# Patient Record
Sex: Female | Born: 1970 | Race: White | Hispanic: No | Smoking: Never smoker
Health system: Southern US, Community
[De-identification: ages and names within clinical notes are randomized; demographics above are authoritative.]

## PROBLEM LIST (undated history)

## (undated) DIAGNOSIS — E039 Hypothyroidism, unspecified: Secondary | ICD-10-CM

## (undated) DIAGNOSIS — K635 Polyp of colon: Secondary | ICD-10-CM

## (undated) DIAGNOSIS — Z86010 Personal history of colonic polyps: Secondary | ICD-10-CM

## (undated) DIAGNOSIS — R112 Nausea with vomiting, unspecified: Secondary | ICD-10-CM

## (undated) DIAGNOSIS — M2170 Unequal limb length (acquired), unspecified site: Secondary | ICD-10-CM

## (undated) DIAGNOSIS — N926 Irregular menstruation, unspecified: Secondary | ICD-10-CM

## (undated) DIAGNOSIS — E282 Polycystic ovarian syndrome: Secondary | ICD-10-CM

## (undated) DIAGNOSIS — N840 Polyp of corpus uteri: Secondary | ICD-10-CM

## (undated) DIAGNOSIS — Z8742 Personal history of other diseases of the female genital tract: Secondary | ICD-10-CM

## (undated) DIAGNOSIS — Z9889 Other specified postprocedural states: Secondary | ICD-10-CM

## (undated) DIAGNOSIS — Z8619 Personal history of other infectious and parasitic diseases: Secondary | ICD-10-CM

## (undated) DIAGNOSIS — Z8719 Personal history of other diseases of the digestive system: Secondary | ICD-10-CM

## (undated) DIAGNOSIS — Z87898 Personal history of other specified conditions: Secondary | ICD-10-CM

## (undated) DIAGNOSIS — B379 Candidiasis, unspecified: Secondary | ICD-10-CM

## (undated) HISTORY — DX: Candidiasis, unspecified: B37.9

## (undated) HISTORY — DX: Personal history of other diseases of the female genital tract: Z87.42

## (undated) HISTORY — DX: Polycystic ovarian syndrome: E28.2

## (undated) HISTORY — DX: Polyp of colon: K63.5

## (undated) HISTORY — DX: Personal history of other specified conditions: Z87.898

## (undated) HISTORY — PX: OTHER SURGICAL HISTORY: SHX169

## (undated) HISTORY — DX: Unequal limb length (acquired), unspecified site: M21.70

## (undated) HISTORY — DX: Personal history of other diseases of the digestive system: Z87.19

## (undated) HISTORY — DX: Personal history of colonic polyps: Z86.010

## (undated) HISTORY — DX: Polyp of corpus uteri: N84.0

## (undated) HISTORY — DX: Personal history of other infectious and parasitic diseases: Z86.19

## (undated) HISTORY — DX: Irregular menstruation, unspecified: N92.6

## (undated) HISTORY — PX: COLONOSCOPY W/ POLYPECTOMY: SHX1380

---

## 2003-08-01 DIAGNOSIS — Z87898 Personal history of other specified conditions: Secondary | ICD-10-CM

## 2003-08-01 DIAGNOSIS — N926 Irregular menstruation, unspecified: Secondary | ICD-10-CM

## 2003-08-01 HISTORY — DX: Irregular menstruation, unspecified: N92.6

## 2003-08-01 HISTORY — DX: Personal history of other specified conditions: Z87.898

## 2003-09-29 ENCOUNTER — Encounter: Admission: RE | Admit: 2003-09-29 | Discharge: 2003-09-29 | Payer: Self-pay | Admitting: Internal Medicine

## 2003-11-09 ENCOUNTER — Encounter: Admission: RE | Admit: 2003-11-09 | Discharge: 2003-11-09 | Payer: Self-pay | Admitting: Internal Medicine

## 2004-01-19 ENCOUNTER — Other Ambulatory Visit: Admission: RE | Admit: 2004-01-19 | Discharge: 2004-01-19 | Payer: Self-pay | Admitting: Obstetrics and Gynecology

## 2004-03-24 ENCOUNTER — Encounter (INDEPENDENT_AMBULATORY_CARE_PROVIDER_SITE_OTHER): Payer: Self-pay | Admitting: Specialist

## 2004-03-24 ENCOUNTER — Ambulatory Visit (HOSPITAL_COMMUNITY): Admission: RE | Admit: 2004-03-24 | Discharge: 2004-03-24 | Payer: Self-pay | Admitting: Obstetrics and Gynecology

## 2004-03-24 DIAGNOSIS — N840 Polyp of corpus uteri: Secondary | ICD-10-CM

## 2004-03-24 HISTORY — DX: Polyp of corpus uteri: N84.0

## 2004-03-24 HISTORY — PX: HYSTEROSCOPY W/D&C: SHX1775

## 2004-07-31 HISTORY — PX: OTHER SURGICAL HISTORY: SHX169

## 2004-08-17 ENCOUNTER — Encounter: Admission: RE | Admit: 2004-08-17 | Discharge: 2004-08-17 | Payer: Self-pay | Admitting: Internal Medicine

## 2004-09-14 ENCOUNTER — Encounter: Admission: RE | Admit: 2004-09-14 | Discharge: 2004-09-14 | Payer: Self-pay | Admitting: Family Medicine

## 2004-12-19 ENCOUNTER — Ambulatory Visit (HOSPITAL_COMMUNITY): Admission: RE | Admit: 2004-12-19 | Discharge: 2004-12-19 | Payer: Self-pay | Admitting: Orthopedic Surgery

## 2004-12-19 ENCOUNTER — Ambulatory Visit (HOSPITAL_BASED_OUTPATIENT_CLINIC_OR_DEPARTMENT_OTHER): Admission: RE | Admit: 2004-12-19 | Discharge: 2004-12-19 | Payer: Self-pay | Admitting: Orthopedic Surgery

## 2005-01-10 ENCOUNTER — Other Ambulatory Visit: Admission: RE | Admit: 2005-01-10 | Discharge: 2005-01-10 | Payer: Self-pay | Admitting: Obstetrics and Gynecology

## 2006-01-17 ENCOUNTER — Other Ambulatory Visit: Admission: RE | Admit: 2006-01-17 | Discharge: 2006-01-17 | Payer: Self-pay | Admitting: Obstetrics and Gynecology

## 2006-07-31 DIAGNOSIS — Z8742 Personal history of other diseases of the female genital tract: Secondary | ICD-10-CM

## 2006-07-31 HISTORY — DX: Personal history of other diseases of the female genital tract: Z87.42
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
## 2009-01-20 ENCOUNTER — Encounter (INDEPENDENT_AMBULATORY_CARE_PROVIDER_SITE_OTHER): Payer: Self-pay | Admitting: *Deleted

## 2009-02-02 ENCOUNTER — Ambulatory Visit: Payer: Self-pay | Admitting: Family Medicine

## 2009-02-02 DIAGNOSIS — M2170 Unequal limb length (acquired), unspecified site: Secondary | ICD-10-CM

## 2009-02-02 DIAGNOSIS — M25559 Pain in unspecified hip: Secondary | ICD-10-CM

## 2009-02-04 ENCOUNTER — Telehealth (INDEPENDENT_AMBULATORY_CARE_PROVIDER_SITE_OTHER): Payer: Self-pay | Admitting: *Deleted

## 2009-02-04 LAB — CONVERTED CEMR LAB
BUN: 9 mg/dL (ref 6–23)
Basophils Absolute: 0 10*3/uL (ref 0.0–0.1)
Cholesterol: 171 mg/dL (ref 0–200)
GFR calc non Af Amer: 73.6 mL/min (ref >60)
Glucose, Bld: 90 mg/dL (ref 70–99)
HCT: 41.2 % (ref 36.0–46.0)
Lymphs Abs: 1.8 10*3/uL (ref 0.7–4.0)
MCV: 90.3 fL (ref 78.0–100.0)
Monocytes Absolute: 0.5 10*3/uL (ref 0.1–1.0)
Monocytes Relative: 9.2 % (ref 3.0–12.0)
Platelets: 218 10*3/uL (ref 150.0–400.0)
Potassium: 4.6 meq/L (ref 3.5–5.1)
RDW: 11.8 % (ref 11.5–14.6)
TSH: 4.28 microintl units/mL (ref 0.35–5.50)
Total Bilirubin: 0.7 mg/dL (ref 0.3–1.2)
Triglycerides: 89 mg/dL (ref 0.0–149.0)
VLDL: 17.8 mg/dL (ref 0.0–40.0)

## 2009-02-16 ENCOUNTER — Ambulatory Visit: Payer: Self-pay | Admitting: Sports Medicine

## 2009-02-16 DIAGNOSIS — M629 Disorder of muscle, unspecified: Secondary | ICD-10-CM

## 2009-03-05 ENCOUNTER — Ambulatory Visit: Payer: Self-pay | Admitting: Family Medicine

## 2009-03-19 ENCOUNTER — Encounter: Admission: RE | Admit: 2009-03-19 | Discharge: 2009-03-19 | Payer: Self-pay | Admitting: Family Medicine

## 2009-03-24 ENCOUNTER — Encounter: Payer: Self-pay | Admitting: Family Medicine

## 2009-03-29 ENCOUNTER — Encounter: Admission: RE | Admit: 2009-03-29 | Discharge: 2009-03-29 | Payer: Self-pay | Admitting: Family Medicine

## 2009-03-30 ENCOUNTER — Ambulatory Visit: Payer: Self-pay | Admitting: Sports Medicine

## 2009-04-09 ENCOUNTER — Ambulatory Visit: Payer: Self-pay | Admitting: Internal Medicine

## 2009-04-09 ENCOUNTER — Telehealth (INDEPENDENT_AMBULATORY_CARE_PROVIDER_SITE_OTHER): Payer: Self-pay | Admitting: *Deleted

## 2009-04-23 ENCOUNTER — Ambulatory Visit: Payer: Self-pay | Admitting: Internal Medicine

## 2009-04-23 ENCOUNTER — Encounter: Payer: Self-pay | Admitting: Internal Medicine

## 2009-04-23 DIAGNOSIS — Z860101 Personal history of adenomatous and serrated colon polyps: Secondary | ICD-10-CM

## 2009-04-23 DIAGNOSIS — Z86010 Personal history of colonic polyps: Secondary | ICD-10-CM

## 2009-04-23 HISTORY — DX: Personal history of adenomatous and serrated colon polyps: Z86.0101

## 2009-04-23 HISTORY — DX: Personal history of colonic polyps: Z86.010

## 2009-04-27 ENCOUNTER — Encounter: Payer: Self-pay | Admitting: Internal Medicine

## 2009-04-27 ENCOUNTER — Telehealth: Payer: Self-pay | Admitting: Internal Medicine

## 2009-07-21 ENCOUNTER — Ambulatory Visit: Payer: Self-pay | Admitting: Family Medicine

## 2009-07-26 ENCOUNTER — Encounter (INDEPENDENT_AMBULATORY_CARE_PROVIDER_SITE_OTHER): Payer: Self-pay | Admitting: *Deleted

## 2009-07-26 LAB — CONVERTED CEMR LAB
Free T4: 0.8 ng/dL (ref 0.6–1.6)
TSH: 3.72 microintl units/mL (ref 0.35–5.50)

## 2010-02-14 ENCOUNTER — Ambulatory Visit: Payer: Self-pay | Admitting: Family Medicine

## 2010-02-14 DIAGNOSIS — L259 Unspecified contact dermatitis, unspecified cause: Secondary | ICD-10-CM | POA: Insufficient documentation

## 2010-02-16 ENCOUNTER — Telehealth: Payer: Self-pay | Admitting: Family Medicine

## 2010-02-16 LAB — CONVERTED CEMR LAB: IgE (Immunoglobulin E), Serum: 112.9 intl units/mL (ref 0.0–180.0)

## 2010-05-05 ENCOUNTER — Ambulatory Visit: Payer: Self-pay | Admitting: Family Medicine

## 2010-05-05 ENCOUNTER — Encounter: Admission: RE | Admit: 2010-05-05 | Discharge: 2010-05-05 | Payer: Self-pay | Admitting: Family Medicine

## 2010-05-05 ENCOUNTER — Encounter: Payer: Self-pay | Admitting: Family Medicine

## 2010-05-05 DIAGNOSIS — R000 Tachycardia, unspecified: Secondary | ICD-10-CM | POA: Insufficient documentation

## 2010-05-06 LAB — CONVERTED CEMR LAB
ALT: 14 units/L (ref 0–35)
Albumin: 3.6 g/dL (ref 3.5–5.2)
Alkaline Phosphatase: 41 units/L (ref 39–117)
Basophils Relative: 0.9 % (ref 0.0–3.0)
CO2: 25 meq/L (ref 19–32)
Calcium: 9 mg/dL (ref 8.4–10.5)
Chloride: 104 meq/L (ref 96–112)
Eosinophils Absolute: 0.1 10*3/uL (ref 0.0–0.7)
Hemoglobin: 14 g/dL (ref 12.0–15.0)
Lymphocytes Relative: 30.7 % (ref 12.0–46.0)
MCHC: 34.5 g/dL (ref 30.0–36.0)
MCV: 90.8 fL (ref 78.0–100.0)
Neutro Abs: 3.7 10*3/uL (ref 1.4–7.7)
RBC: 4.46 M/uL (ref 3.87–5.11)
Sodium: 136 meq/L (ref 135–145)
Total CHOL/HDL Ratio: 5
Total Protein: 6.7 g/dL (ref 6.0–8.3)

## 2010-07-31 DIAGNOSIS — Z8719 Personal history of other diseases of the digestive system: Secondary | ICD-10-CM

## 2010-07-31 HISTORY — DX: Personal history of other diseases of the digestive system: Z87.19

## 2010-08-21 ENCOUNTER — Encounter: Payer: Self-pay | Admitting: Internal Medicine

## 2010-08-21 ENCOUNTER — Encounter: Payer: Self-pay | Admitting: Family Medicine

## 2010-08-22 ENCOUNTER — Encounter: Payer: Self-pay | Admitting: Family Medicine
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
## 2010-08-30 NOTE — Assessment & Plan Note (Signed)
Summary: rash on neck/kn   Vital Signs:  Patient profile:   40 year old female Weight:      193.4 pounds Temp:     98.5 degrees F oral BP sitting:   110 / 80 Cuff size:   large  Vitals Entered By: Kathrynn Speed CMA (February 14, 2010 2:33 PM) CC: rash on neck & down chest on naval & some on her arms, src   History of Present Illness: 40 yo woman here today for rash.  sxs started Wed, was very pale.  has progressively worsened/darkened.  was using hydrocortisone and benadryl w/out relief- 'it just makes me tired'.  started on neck and has spread down onto chest, abd, R eyelid, and L forearm.  pt was pulling weeds last weekend, sxs started 4 days later.  no changes in soaps, lotions, detergents, meds.  gave dog a bath "Sunday before sxs started.  Current Medications (verified): 1)  Necon 10/11 (28) 35 Mcg Tabs (Norethin-Eth Estrad Biphasic) .... Once A Day  Allergies (verified): 1)  ! Cipro  Past History:  Social History: Last updated: 02/02/2009 works as PT assistant, [employer] lives alone, w/ dog  Review of Systems      See HPI  Physical Exam  General:  Well-developed,well-nourished,in no acute distress; alert,appropriate and cooperative throughout examination Skin:  dry, erythematous rash on anterior neck extending onto chest.  small vesicles present on chest, abd, arms.  erythematous area on R eyelid.  no pus, induration, or fluctuance.   Impression & Recommendations:  Problem # 1:  CONTACT DERMATITIS (ICD-692.9) Assessment New pt unable to pinpoint cause but given fact areas are spreading will start prednisone for both the itching and the progression.  will test pt for possible allergies.  doesn't appear to be a food allergy as pt doesn't have hives.  reviewed supportive care and red flags that should prompt return.  Pt expresses understanding and is in agreement w/ this plan. Orders: Venipuncture (36415) T-Allergy Profile Region [place], [place], [place], [place], [place] (5484) T-Food  Allergy Profile Specific IgE (86003/[id number])  Her updated medication list for this problem includes:    Prednisone 20 Mg Tabs (Prednisone) ..... 2 tabs daily x14 days  Complete Medication List: 1)  Necon 10/11 (28) 35 Mcg Tabs (Norethin-eth estrad biphasic) .... Once a day 2)  Prednisone 20 Mg Tabs (Prednisone) .... 2 tabs daily x14 days  Patient Instructions: 1)  Follow up as needed 2)  Continue the cortisone cream as needed for itch 3)  Take the prednisone w/ food to avoid upset stomach- 2 daily for 14 days 4)  We'll notify you of your lab results 5)  Call with any questions or concerns 6)  Hang in there!! Prescriptions: PREDNISONE 20 MG TABS (PREDNISONE) 2 tabs daily x14 days  #28 x 0   Entered and Authorized by:   Meilani Edmundson MD   Signed by:   Jafari Mckillop MD on 02/14/2010   Method used:   Electronically to        [facility]  [address] (retail)       [address] [place], [place]  [address]       Ph: [phone]       Fax: [phone]   RxID:   [id number]

## 2010-08-30 NOTE — Progress Notes (Signed)
Summary: labs-lmom  Phone Note Outgoing Call   Summary of Call: only apparent significant allergy is to dust mites.  mild allergy to pet dander.  no food allergies identified.  please call her and let her know.  Left message on machine to call back to office. Signed by Lucious Groves CMA on 02/16/2010 at 8:51 AM  Follow-up for Phone Call        Patient notified. Follow-up by: Lucious Groves CMA,  February 16, 2010 4:25 PM

## 2010-12-16 NOTE — Op Note (Signed)
NAME:  EMBERLEE, SORTINO                ACCOUNT NO.:  [id number]   MEDICAL RECORD NO.:  [id number]          PATIENT TYPE:  AMB   LOCATION:  DSC                          FACILITY:  [facility]   PHYSICIAN:  Harvie Junior, M.D.   DATE OF BIRTH:  1971/05/17   DATE OF PROCEDURE:  12/19/2004  DATE OF DISCHARGE:                                 OPERATIVE REPORT   PREOPERATIVE DIAGNOSIS:  Right wrist pain, status post workup and injection  therapy.   POSTOPERATIVE DIAGNOSIS:  Right wrist pain, status post workup and injection  therapy.   PROCEDURE:  1.  Right wrist arthroscopy with evaluation of wrist joint, debridement of      partial scapho-lunate inter-osseous ligament tear.  2.  Debridement of chondromalacia of the lunate.  3.  Debridement of triangular fibrocartilage tear, peripheral.   SURGEON:  Harvie Junior, M.D.   ANESTHESIA:  General.   INDICATIONS FOR PROCEDURE:  Ms. Oletha Blend is a 40 year old female with a long  history of having had wrist pain.  She ultimately was evaluated with plain  films and MRI which showed no significant pathology other than a small pin-  hole tear of the TFC.  Because of continued complaints of pain, she was  ultimately taken to the operating room for an operative wrist arthroscopy  after a failure of all conservative care.   DESCRIPTION OF PROCEDURE:  The patient was taken to the operating room and  after adequate anesthesia was obtained with general anesthetic, the patient  was placed on the operating table.  The right wrist was prepped and draped  in the usual sterile fashion.  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  This  was debrided to a smooth and stable rim.  The scapho-lunate osseous ligament  was obviously partially torn and there was some tendency towards  dissociation of the scaphoid and lunate.  This  was debrided and an Arthro-  Care wand was used to clean this up quite nicely.   Once this was accomplished, attention was turned over to the triangular  fibrocartilage area where the small dorsal rim tear of the triangular  fibrocartilage area was evaluated and noted to be within normal limits.  There was a small dorsal rent.  At this point the dorsal area was cleaned up  and the Arthro-Care wand was used in this area as well.  Following this, the  wrist was copiously irrigated and suctioned dry.  The arthroscopic portion  closed with a bandage.  A sterile compressive dressing was applied.   The patient was taken to the recovery room and was noted to be in  satisfactory condition.   ESTIMATED BLOOD LOSS:  None.       JLG/MEDQ  D:  12/19/2004  T:  12/19/2004  Job:  [id number]

## 2010-12-16 NOTE — H&P (Signed)
NAME:  Suzanne Taylor, Suzanne Taylor                          ACCOUNT NO.:  [id number]   MEDICAL RECORD NO.:  [id number]                   PATIENT TYPE:  AMB   LOCATION:  SDC                                  [facility]ILITY:  [facility]   PHYSICIAN:  Janine Limbo, M.D.            DATE OF BIRTH:  Dec 10, 1970   DATE OF ADMISSION:  03/24/2004  DATE OF DISCHARGE:                                HISTORY & PHYSICAL   HISTORY OF PRESENT ILLNESS:  The patient is a 40 year old female, para 0,  gravida 0, who complains of a 2-3 year history of irregular cycles. An  ultrasound showed what appeared to be a normal uterus. The endometrium  measured 5-6 mm.  The ovaries appear normal except that there were multiple  small cysts. The patient's most recent Pap smear was within normal limits.  A hydrosonogram was performed, the patient was found to have a 1.9 cm lesion  within the endometrium that was consistent with a polyp.  The patient had  started on oral contraceptives because of a questionable history of  polycystic ovary syndrome.   ALLERGIES:  The patient is allergic to certain FLOXIN medications.   PAST MEDICAL HISTORY:  The patient denies hypertension and diabetes.   SOCIAL HISTORY:  The patient drinks alcohol socially.  She denies cigarette  use and other recreational drug uses.   REVIEW OF SYMPTOMS:  The patient does complain of fatigue.   FAMILY HISTORY:  The patient's mother has heart disease.  The patient's  father died from hypertension and an aneurysm. The father also had kidney  problems. The patient's paternal grandmother had a stroke.   PHYSICAL EXAMINATION:  VITAL SIGNS:  Weight is 211 pounds.  HEENT:  Within normal limits except for fine hair on the face.  CHEST:  Clear.  HEART:  Regular rate and rhythm.  BREASTS:  Without masses.  ABDOMEN:  Nontender.  EXTREMITIES:  Grossly normal.  NEUROLOGIC:  Grossly normal.  PELVIC:  External genitalia is normal. The vagina is normal. Cervix is  nontender.  Uterus normal size, shape and consistency. Adnexa no masses and  rectovaginal exam confirms.   ASSESSMENT:  1. Irregular menstrual cycles.  2. Probable endometrial polyp.  3. Questionable polycystic ovary syndrome.   PLAN:  The patient will undergo hysteroscopy with dilatation and curettage.  We will resect the polyp. The patient understands the indications for her  procedure and she accepts the risk of, but not limited to anesthetic  complications, bleeding, infections and possible damage to the surrounding  organs.                                               Janine Limbo, M.D.    AVS/MEDQ  D:  03/22/2004  T:  03/22/2004  Job:  [id number]  cc:   Sharlet Salina, M.D.  [address]  [place]  [place] [address]  Fax: [phone]

## 2010-12-16 NOTE — Op Note (Signed)
NAME:  Suzanne Taylor, Suzanne Taylor                          ACCOUNT NO.:  [id number]   MEDICAL RECORD NO.:  [id number]                   PATIENT TYPE:  AMB   LOCATION:  [facility]C                                  [facility]   PHYSICIAN:  Janine Limbo, M.D.            DATE OF BIRTH:  1971-03-13   DATE OF PROCEDURE:  03/24/2004  DATE OF DISCHARGE:                                 OPERATIVE REPORT   PREOPERATIVE DIAGNOSES:  1. Irregular menstrual cycles.  2. Endometrial polyp.   POSTOPERATIVE DIAGNOSES:  1. Irregular menstrual cycles.  2. Endometrial polyp.   PROCEDURE:  Hysteroscopy with dilatation and curettage.   SURGEON:  Janine Limbo, M.D.   ANESTHESIA:  General.   DISPOSITION:  Ms. Suzanne Taylor is a 40 year old female, gravida 0, who presents  with the above mentioned diagnosis. She understands the indications for her  procedure and she accepts the risks of but not limited to, anesthetic  complications, bleeding, infections and possible damage to the surrounding  organs.   FINDINGS:  The uterus sounded to 8 cm.  A moderate amount of endometrial  tissue was removed from within the uterine cavity. There was a questionable  small polyp at the fundus.  No adnexal masses were appreciated on  examination under anesthesia.   DESCRIPTION OF PROCEDURE:  The patient was taken to the operating room where  a general anesthetic was given.  The patient's abdomen, perineum, and vagina  were prepped with multiple layers of Betadine. A Foley catheter was placed  in the bladder. The patient was sterilely draped. A paracervical block was  placed using 10 mL of 0.5% Marcaine with epinephrine. An endocervical  curettage was then performed. The uterus sounded to 8 cm. The cervix was  gradually dilated. The diagnostic hysteroscope was inserted and pictures  were taken of the endometrial cavity. The diagnostic hysteroscope was  removed and the cavity was then curetted using a medium sharp curette. The  cavity was felt to be clean at the end of our procedure. The hysteroscope  was again inserted and the cavity was inspected. Hemostasis was adequate.  All instruments were then removed. The estimated blood loss was 10 mL.  The  estimated fluid deficit was 60 mL although there was noted to be fluid on  the floor in the operating room. The patient was awakened from her  anesthetic without difficulty and taken to the recovery room in stable  condition.   FOLLOW UP:  The patient was given a prescription for Tylenol with codeine  and she will take 1 or 2 tablets every 4 hours as needed for pain. She will  return to see Dr. Stefano Gaul in 2-3 weeks for followup examination. She was  given a copy of the postoperative instruction sheet as prepared by the  [facility] for patients who have undergone a dilatation  and curettage.  Janine Limbo, M.D.    AVS/MEDQ  D:  03/24/2004  T:  03/24/2004  Job:  [id number]   cc:   Sharlet Salina, M.D.  [address]  [place]  [place] [address]  Fax: [phone]

## 2010-12-30 HISTORY — PX: WISDOM TOOTH EXTRACTION: SHX21

## 2011-02-23 ENCOUNTER — Ambulatory Visit (INDEPENDENT_AMBULATORY_CARE_PROVIDER_SITE_OTHER): Payer: BC Managed Care – PPO | Admitting: Family Medicine

## 2011-02-23 ENCOUNTER — Encounter: Payer: Self-pay | Admitting: Family Medicine

## 2011-02-23 VITALS — BP 100/70 | Temp 98.9°F | Wt 184.6 lb

## 2011-02-23 DIAGNOSIS — L259 Unspecified contact dermatitis, unspecified cause: Secondary | ICD-10-CM

## 2011-02-23 MED ORDER — TRIAMCINOLONE ACETONIDE 0.1 % EX OINT: TOPICAL_OINTMENT | Freq: Two times a day (BID) | CUTANEOUS | Status: AC

## 2011-02-23 MED ORDER — HYDROXYZINE HCL 50 MG PO TABS: 50.0000 mg | ORAL_TABLET | Freq: Three times a day (TID) | ORAL | Status: AC | PRN

## 2011-02-23 NOTE — Patient Instructions (Signed)
This is poison ivy Take the hydroxyzine as needed for itching- may make you sleepy so try it at night first Use the steroid cream twice a day for itching Call with any questions or concerns Hang in there!

## 2011-02-23 NOTE — Progress Notes (Signed)
  Subjective:    Patient ID: Suzanne Taylor, female    DOB: [birth year], 40 y.o.   MRN: [id number]  HPI Rash- very itchy, first noticed on L wrist and dorsum of L foot.  Has been working in [place] yard weekly.  Has been told by friends and family that it looks like poison ivy.   Review of Systems For ROS see HPI     Objective:   Physical Exam  Vitals reviewed. Constitutional: She appears well-developed and well-nourished. No distress.  Skin: Skin is warm and dry. Rash: on dorsum of L foot and ankle, L wrist- consistent w/ contact dermatitis.          Assessment & Plan:

## 2011-03-07 NOTE — Assessment & Plan Note (Signed)
Pt's rash consistent w/ likely poison ivy.  Start steroid cream bid.  Reviewed supportive care and red flags that should prompt return.  Pt expressed understanding and is in agreement w/ plan.

## 2011-05-23 ENCOUNTER — Other Ambulatory Visit: Payer: Self-pay | Admitting: Family Medicine

## 2011-05-23 DIAGNOSIS — Z1231 Encounter for screening mammogram for malignant neoplasm of breast: Secondary | ICD-10-CM

## 2011-06-20 ENCOUNTER — Ambulatory Visit
Admission: RE | Admit: 2011-06-20 | Discharge: 2011-06-20 | Disposition: A | Discharge status: Home / Self Care | Payer: BC Managed Care – PPO | Source: Ambulatory Visit | Attending: Family Medicine | Admitting: Family Medicine

## 2011-06-20 DIAGNOSIS — Z1231 Encounter for screening mammogram for malignant neoplasm of breast: Secondary | ICD-10-CM

## 2011-06-30 ENCOUNTER — Encounter: Payer: Self-pay | Admitting: Family Medicine

## 2011-06-30 ENCOUNTER — Ambulatory Visit (INDEPENDENT_AMBULATORY_CARE_PROVIDER_SITE_OTHER): Payer: BC Managed Care – PPO | Admitting: Family Medicine

## 2011-06-30 DIAGNOSIS — R238 Other skin changes: Secondary | ICD-10-CM

## 2011-06-30 DIAGNOSIS — Z0000 Encounter for general adult medical examination without abnormal findings: Secondary | ICD-10-CM

## 2011-06-30 DIAGNOSIS — L853 Xerosis cutis: Secondary | ICD-10-CM | POA: Insufficient documentation

## 2011-06-30 DIAGNOSIS — K5900 Constipation, unspecified: Secondary | ICD-10-CM | POA: Insufficient documentation

## 2011-06-30 LAB — BASIC METABOLIC PANEL
BUN: 13 mg/dL (ref 6–23)
Chloride: 108 mEq/L (ref 96–112)
Potassium: 4.2 mEq/L (ref 3.5–5.1)

## 2011-06-30 LAB — CBC WITH DIFFERENTIAL/PLATELET
Basophils Absolute: 0 10*3/uL (ref 0.0–0.1)
Eosinophils Absolute: 0.1 10*3/uL (ref 0.0–0.7)
Eosinophils Relative: 1.7 % (ref 0.0–5.0)
MCHC: 34 g/dL (ref 30.0–36.0)
MCV: 90.8 fl (ref 78.0–100.0)
Monocytes Absolute: 0.5 10*3/uL (ref 0.1–1.0)
Neutrophils Relative %: 50.5 % (ref 43.0–77.0)
Platelets: 248 10*3/uL (ref 150.0–400.0)
RDW: 14.1 % (ref 11.5–14.6)
WBC: 4.7 10*3/uL (ref 4.5–10.5)

## 2011-06-30 LAB — LIPID PANEL
LDL Cholesterol: 114 mg/dL — ABNORMAL HIGH (ref 0–99)
VLDL: 18.8 mg/dL (ref 0.0–40.0)

## 2011-06-30 LAB — HEPATIC FUNCTION PANEL
ALT: 12 U/L (ref 0–35)
AST: 20 U/L (ref 0–37)
Alkaline Phosphatase: 39 U/L (ref 39–117)
Bilirubin, Direct: 0.1 mg/dL (ref 0.0–0.3)
Total Bilirubin: 0.5 mg/dL (ref 0.3–1.2)

## 2011-06-30 NOTE — Assessment & Plan Note (Signed)
Pt's PE WNL.  UTD on health maintenance.  Check labs.  Anticipatory guidance provided.  

## 2011-06-30 NOTE — Assessment & Plan Note (Signed)
Ongoing problem for pt.  Recommended daily Miralax for 2-3 weeks to get bowels moving.  Pt in agreement.  Will follow.

## 2011-06-30 NOTE — Progress Notes (Signed)
  Subjective:    Patient ID: Suzanne Taylor, female    DOB: Apr 15, 1971, 40 y.o.   MRN: [id number]  HPI CPE- UTD on pap and mammo.  Had colonoscopy 2 yrs ago due to family hx and polyps found ([place]).  Constipation- reports bowels have always been slow.  Will take 3 days of miralax every 2-3 weeks and won't have results until Suzanne next Thursday or Friday.  Taking fiber supplements intermittently.  Eating high fiber diet.  Drinking plenty of water.  Dry skin- very dry in places, red inflamed from scratching.  Will have marks where things rub her skin   Review of Systems Patient reports no vision/ hearing changes, adenopathy, fever, weight change,  persistant/recurrent hoarseness , swallowing issues, chest pain, palpitations, edema, persistant/recurrent cough, hemoptysis, dyspnea (rest/exertional/paroxysmal nocturnal), gastrointestinal bleeding (melena, rectal bleeding), abdominal pain, significant heartburn, bowel changes, GU symptoms (dysuria, hematuria, incontinence), Gyn symptoms (abnormal  bleeding, pain),  syncope, focal weakness, memory loss, numbness & tingling, skin/hair/nail changes, abnormal bruising or bleeding, anxiety, or depression.     Objective:   Physical Exam General Appearance:    Alert, cooperative, no distress, appears stated age  Head:    Normocephalic, without obvious abnormality, atraumatic  Eyes:    PERRL, conjunctiva/corneas clear, EOM's intact, fundi    benign, both eyes  Ears:    Normal TM's and external ear canals, both ears  Nose:   Nares normal, septum midline, mucosa normal, no drainage    or sinus tenderness  Throat:   Lips, mucosa, and tongue normal; teeth and gums normal  Neck:   Supple, symmetrical, trachea midline, no adenopathy;    Thyroid: no enlargement/tenderness/nodules  Back:     Symmetric, no curvature, ROM normal, no CVA tenderness  Lungs:     Clear to auscultation bilaterally, respirations unlabored  Chest Wall:    No tenderness or deformity   Heart:    Regular rate and rhythm, S1 and S2 normal, no murmur, rub   or gallop  Breast Exam:    Deferred to GYN  Abdomen:     Soft, non-tender, bowel sounds active all four quadrants,    no masses, no organomegaly  Genitalia:    Deferred to GYN  Rectal:    Extremities:   Extremities normal, atraumatic, no cyanosis or edema  Pulses:   2+ and symmetric all extremities  Skin:   Skin color, texture, turgor normal, no rashes or lesions.  Very dry, erythematous patch on L shoulder  Lymph nodes:   Cervical, supraclavicular, and axillary nodes normal  Neurologic:   CNII-XII intact, normal strength, sensation and reflexes    throughout          Assessment & Plan:

## 2011-06-30 NOTE — Patient Instructions (Signed)
Follow up in 1 year or as needed You look great!  Keep up the good work! Use the steroid cream on the dry, itchy patches Use lotion regularly to keep skin hydrated Take Claritin or Zyrtec daily to decrease post nasal drip and skin response Call with any questions or concerns Happy Holidays!

## 2011-06-30 NOTE — Assessment & Plan Note (Signed)
Very dry and itchy.  Inflamed from scratching.  Some dermatographia.  Start steroid cream to patch on L shoulder.  Encouraged regular moisturizer use.  Daily antihistamine to decrease inflammatory response.  Pt expressed understanding and is in agreement w/ plan.

## 2011-07-02 LAB — VITAMIN D 1,25 DIHYDROXY
Vitamin D 1, 25 (OH)2 Total: 82 pg/mL — ABNORMAL HIGH (ref 18–72)
Vitamin D3 1, 25 (OH)2: 82 pg/mL

## 2011-07-03 ENCOUNTER — Encounter: Payer: Self-pay | Admitting: *Deleted

## 2012-02-13 ENCOUNTER — Ambulatory Visit (INDEPENDENT_AMBULATORY_CARE_PROVIDER_SITE_OTHER): Payer: BC Managed Care – PPO | Admitting: Obstetrics and Gynecology

## 2012-02-13 ENCOUNTER — Encounter: Payer: Self-pay | Admitting: Obstetrics and Gynecology

## 2012-02-13 VITALS — BP 110/62 | Resp 16 | Ht 69.0 in | Wt 176.0 lb

## 2012-02-13 DIAGNOSIS — K635 Polyp of colon: Secondary | ICD-10-CM

## 2012-02-13 DIAGNOSIS — D126 Benign neoplasm of colon, unspecified: Secondary | ICD-10-CM

## 2012-02-13 DIAGNOSIS — Z124 Encounter for screening for malignant neoplasm of cervix: Secondary | ICD-10-CM

## 2012-02-13 DIAGNOSIS — Z01419 Encounter for gynecological examination (general) (routine) without abnormal findings: Secondary | ICD-10-CM

## 2012-02-13 DIAGNOSIS — Z800 Family history of malignant neoplasm of digestive organs: Secondary | ICD-10-CM

## 2012-02-13 DIAGNOSIS — N926 Irregular menstruation, unspecified: Secondary | ICD-10-CM

## 2012-02-13 MED ORDER — NORETHINDRONE-ETH ESTRADIOL 0.5-35 MG-MCG PO TABS: 1.0000 | ORAL_TABLET | Freq: Every day | ORAL | Status: DC

## 2012-02-13 NOTE — Progress Notes (Signed)
Regular Periods: yes Mammogram: yes "06/2011"  Monthly Breast Ex.: no Exercise: yes  Tetanus < 10 years: yes Seatbelts: yes  NI. Bladder Functn.: yes Abuse at home: no  Daily BM's: yes Stressful Work: no "average"  Healthy Diet: yes Sigmoid-Colonoscopy: "2010" Polyp"  Calcium: no Medical problems this year: None per pt   LAST PAP:02/13/2011 "WNL"  Contraception: NECON  Mammogram:  06/2011 "WNL"  PCP: Dr.Tabori  PMH: No Changes  FMH: No Changes  Last Bone Scan: N/A  ANNUAL GYNECOLOGIC EXAMINATION   Suzanne Taylor is a 41 y.o. female, No obstetric history on file., who presents for an annual exam. See above. The patient has a history of irregular menstrual cycles.  Her cycles are well controlled with oral contraceptives.  She is not sexually active.  Her mother has colon cancer.  The patient has had polyps on her colonoscopy.  She does have constipation.  Prior Hysterectomy: No    History   Social History  . Marital Status: Single    Spouse Name: N/A    Number of Children: N/A  . Years of Education: N/A   Social History Main Topics  . Smoking status: Never Smoker   . Smokeless tobacco: Never Used  . Alcohol Use: Yes  . Drug Use: No  . Sexually Active: None   Other Topics Concern  . None   Social History Narrative   Lives alone with dog.    Menstrual cycle:   LMP: Patient's last menstrual period was 01/24/2012.           Cycle: Regular, monthly with normal flow and no severe dysmenorrha  The following portions of the patient's history were reviewed and updated as appropriate: allergies, current medications, past family history, past medical history, past social history, past surgical history and problem list.  Review of Systems Pertinent items are noted in HPI. Breast:Negative for breast lump,nipple discharge or nipple retraction Gastrointestinal: Negative for abdominal pain, change in bowel habits or rectal bleeding Urinary:negative   Objective:    BP  110/62  Resp 16  Ht 5\' 9"  (1.753 m)  Wt 176 lb (79.833 kg)  BMI 25.99 kg/m2  LMP 01/24/2012    Weight:  Wt Readings from Last 1 Encounters:  02/13/12 176 lb (79.833 kg)          BMI: Body mass index is 25.99 kg/(m^2).  General Appearance: Alert, appropriate appearance for age. No acute distress HEENT: Grossly normal Neck / Thyroid: Supple, no masses, nodes or enlargement Lungs: clear to auscultation bilaterally Back: No CVA tenderness Breast Exam: No masses or nodes.No dimpling, nipple retraction or discharge. Cardiovascular: Regular rate and rhythm. S1, S2, no murmur Gastrointestinal: Soft, non-tender, no masses or organomegaly  ++++++++++++++++++++++++++++++++++++++++++++++++++++++++  Pelvic Exam: External genitalia: normal general appearance Vaginal: normal without tenderness, induration or masses and relaxation: No Cervix: normal appearance Adnexa: normal bimanual exam Uterus: normal size, shape, and consistency Rectovaginal: normal rectal, no masses  ++++++++++++++++++++++++++++++++++++++++++++++++++++++++  Lymphatic Exam: Non-palpable nodes in neck, clavicular, axillary, or inguinal regions Neurologic: Normal speech, no tremor  Psychiatric: Alert and oriented, appropriate affect.   Wet Prep:   not applicable Urinalysis:  not applicable UPT:           Not done   Assessment:    Normal gyn exam   Overweight or obese: Yes   Pelvic relaxation: No  Mother with colon cancer.  Constipation  Polyps on colonoscopy.  Irregular cycles/PCOS controlled with oral contraceptives.   Plan:    mammogram pap smear  return annually or prn Contraception:abstinence    Medications prescribed: Necon 0.5/35  STD screen request: none  RPR: No.   HBsAg: No.  Hepatitis C: No.  The updated Pap smear screening guidelines were discussed with the patient. The patient requested that I obtain a Pap smear: Yes.  Kegel exercises discussed: Yes.  Proper diet and regular  exercise were reviewed.  Annual mammograms recommended starting at age 51. Proper breast care was discussed.  Screening colonoscopy is recommended beginning at age 37.  Regular health maintenance was reviewed.  Sleep hygiene was discussed.  Adequate calcium intake was emphasized. The patient has a history of elevated vitamin D.  Mylinda Latina.D.

## 2012-03-18 ENCOUNTER — Ambulatory Visit (HOSPITAL_BASED_OUTPATIENT_CLINIC_OR_DEPARTMENT_OTHER)
Admission: RE | Admit: 2012-03-18 | Discharge: 2012-03-18 | Disposition: A | Discharge status: Home / Self Care | Payer: BC Managed Care – PPO | Source: Ambulatory Visit | Attending: Family Medicine | Admitting: Family Medicine

## 2012-03-18 ENCOUNTER — Ambulatory Visit (INDEPENDENT_AMBULATORY_CARE_PROVIDER_SITE_OTHER): Payer: BC Managed Care – PPO | Admitting: Family Medicine

## 2012-03-18 VITALS — BP 116/76 | HR 74 | Temp 98.0°F | Wt 176.0 lb

## 2012-03-18 DIAGNOSIS — M549 Dorsalgia, unspecified: Secondary | ICD-10-CM | POA: Insufficient documentation

## 2012-03-18 DIAGNOSIS — R1032 Left lower quadrant pain: Secondary | ICD-10-CM | POA: Insufficient documentation

## 2012-03-18 DIAGNOSIS — R319 Hematuria, unspecified: Secondary | ICD-10-CM

## 2012-03-18 LAB — POCT URINALYSIS DIPSTICK
Bilirubin, UA: NEGATIVE
Glucose, UA: NEGATIVE
Nitrite, UA: NEGATIVE
Spec Grav, UA: 1.01

## 2012-03-18 MED ORDER — CYCLOBENZAPRINE HCL 10 MG PO TABS: 10.0000 mg | ORAL_TABLET | Freq: Three times a day (TID) | ORAL | Status: AC | PRN

## 2012-03-18 MED ORDER — CEPHALEXIN 500 MG PO CAPS: 500.0000 mg | ORAL_CAPSULE | Freq: Two times a day (BID) | ORAL | Status: AC

## 2012-03-18 MED ORDER — HYDROCODONE-ACETAMINOPHEN 5-500 MG PO TABS: 1.0000 | ORAL_TABLET | Freq: Three times a day (TID) | ORAL | Status: AC | PRN

## 2012-03-18 NOTE — Progress Notes (Signed)
  Subjective:    Patient ID: Suzanne Taylor, female    DOB: 04-Apr-1971, 41 y.o.   MRN: [id number]  HPI L back pain- sxs started yesterday.  No heavy lifting.  No recent change in activity level.  Pain is located from mid thoracic down.  No change in urination- no blood, frequency, urgency.  No fevers.  Reports this feels muscular. Worse w/ movement.  Nothing improves pain- no relief w/ lidocaine patch.   Review of Systems     Objective:   Physical Exam  Vitals reviewed. Constitutional: She appears well-developed and well-nourished. She appears distressed (obviously uncomfortable).  Abdominal: Soft. She exhibits no distension. There is tenderness (CVA pain, no suprapubic pain). There is no rebound and no guarding.  Musculoskeletal: She exhibits tenderness (only over CVA, no pain along spine, paraspinal muscles, or ribs).          Assessment & Plan:

## 2012-03-18 NOTE — Patient Instructions (Addendum)
I think this is a kidney stone We'll notify you of your CT results Start the Vicodin as needed for pain Hang in there!!!

## 2012-03-19 LAB — URINE CULTURE

## 2012-03-19 NOTE — Assessment & Plan Note (Signed)
New.  Concern for stone given pt's degree of discomfort and fact that it's centered over L kidney.  UA shows large blood that would be consistent w/ stone.  Get CT to assess.  Start Vicodin for pain relief.  If CT negative for stone, will start abx for presumed pyelo.  Pt expressed understanding and is in agreement w/ plan.

## 2012-03-19 NOTE — Assessment & Plan Note (Signed)
New.  See above discussion. 

## 2012-07-09 ENCOUNTER — Other Ambulatory Visit: Payer: Self-pay | Admitting: Family Medicine

## 2012-07-09 DIAGNOSIS — Z1231 Encounter for screening mammogram for malignant neoplasm of breast: Secondary | ICD-10-CM

## 2012-08-15 ENCOUNTER — Ambulatory Visit
Admission: RE | Admit: 2012-08-15 | Discharge: 2012-08-15 | Disposition: A | Discharge status: Home / Self Care | Payer: BC Managed Care – PPO | Source: Ambulatory Visit | Attending: Family Medicine | Admitting: Family Medicine

## 2012-08-15 DIAGNOSIS — Z1231 Encounter for screening mammogram for malignant neoplasm of breast: Secondary | ICD-10-CM

## 2012-09-06 ENCOUNTER — Encounter: Payer: Self-pay | Admitting: Family Medicine

## 2012-09-06 ENCOUNTER — Ambulatory Visit (INDEPENDENT_AMBULATORY_CARE_PROVIDER_SITE_OTHER): Payer: BC Managed Care – PPO | Admitting: Family Medicine

## 2012-09-06 VITALS — BP 120/78 | HR 83 | Temp 98.6°F | Ht 68.5 in | Wt 180.6 lb

## 2012-09-06 DIAGNOSIS — Z0000 Encounter for general adult medical examination without abnormal findings: Secondary | ICD-10-CM

## 2012-09-06 LAB — CBC WITH DIFFERENTIAL/PLATELET
Basophils Relative: 0.8 % (ref 0.0–3.0)
Eosinophils Relative: 1.2 % (ref 0.0–5.0)
HCT: 40.4 % (ref 36.0–46.0)
Lymphs Abs: 1.9 10*3/uL (ref 0.7–4.0)
MCHC: 33.2 g/dL (ref 30.0–36.0)
MCV: 90.3 fl (ref 78.0–100.0)
Monocytes Absolute: 0.4 10*3/uL (ref 0.1–1.0)
Platelets: 231 10*3/uL (ref 150.0–400.0)
RBC: 4.47 Mil/uL (ref 3.87–5.11)
WBC: 5.5 10*3/uL (ref 4.5–10.5)

## 2012-09-06 LAB — HEPATIC FUNCTION PANEL: Albumin: 3.7 g/dL (ref 3.5–5.2)

## 2012-09-06 LAB — TSH: TSH: 2.88 u[IU]/mL (ref 0.35–5.50)

## 2012-09-06 LAB — BASIC METABOLIC PANEL
BUN: 14 mg/dL (ref 6–23)
Calcium: 9 mg/dL (ref 8.4–10.5)
GFR: 73.28 mL/min (ref >60.00)
Glucose, Bld: 84 mg/dL (ref 70–99)

## 2012-09-06 LAB — LIPID PANEL
Cholesterol: 164 mg/dL (ref 0–200)
HDL: 39 mg/dL — ABNORMAL LOW (ref >39.00)
Triglycerides: 85 mg/dL (ref 0.0–149.0)

## 2012-09-06 NOTE — Progress Notes (Signed)
  Subjective:    Patient ID: Suzanne Taylor, female    DOB: January 30, 1971, 42 y.o.   MRN: [id number]  HPI CPE- UTD on GYN, colonoscopy.   Review of Systems Patient reports no vision/ hearing changes, adenopathy,fever, weight change,  persistant/recurrent hoarseness , swallowing issues, chest pain, palpitations, edema, persistant/recurrent cough, hemoptysis, dyspnea (rest/exertional/paroxysmal nocturnal), gastrointestinal bleeding (melena, rectal bleeding), abdominal pain, significant heartburn, bowel changes, GU symptoms (dysuria, hematuria, incontinence), Gyn symptoms (abnormal  bleeding, pain),  syncope, focal weakness, memory loss, numbness & tingling, skin/hair/nail changes, abnormal bruising or bleeding, anxiety, or depression.     Objective:   Physical Exam General Appearance:    Alert, cooperative, no distress, appears stated age  Head:    Normocephalic, without obvious abnormality, atraumatic  Eyes:    PERRL, conjunctiva/corneas clear, EOM's intact, fundi    benign, both eyes  Ears:    Normal TM's and external ear canals, both ears  Nose:   Nares normal, septum midline, mucosa normal, no drainage    or sinus tenderness  Throat:   Lips, mucosa, and tongue normal; teeth and gums normal  Neck:   Supple, symmetrical, trachea midline, no adenopathy;    Thyroid: no enlargement/tenderness/nodules  Back:     Symmetric, no curvature, ROM normal, no CVA tenderness  Lungs:     Clear to auscultation bilaterally, respirations unlabored  Chest Wall:    No tenderness or deformity   Heart:    Regular rate and rhythm, S1 and S2 normal, no murmur, rub   or gallop  Breast Exam:    Deferred to GYN  Abdomen:     Soft, non-tender, bowel sounds active all four quadrants,    no masses, no organomegaly  Genitalia:    Deferred to GYN  Rectal:    Extremities:   Extremities normal, atraumatic, no cyanosis or edema  Pulses:   2+ and symmetric all extremities  Skin:   Skin color, texture, turgor normal, no  rashes or lesions  Lymph nodes:   Cervical, supraclavicular, and axillary nodes normal  Neurologic:   CNII-XII intact, normal strength, sensation and reflexes    throughout          Assessment & Plan:

## 2012-09-06 NOTE — Patient Instructions (Addendum)
Follow up in 1 year or as needed Keep up the good work!  You look great! We'll notify you of your lab results and make any changes if needed Call with any questions or concerns Happy Early Birthday!!! 

## 2012-09-06 NOTE — Assessment & Plan Note (Signed)
Pt's PE WNL.  UTD on GYN, colonoscopy.  Check labs.  Anticipatory guidance provided.  

## 2012-09-09 ENCOUNTER — Encounter: Payer: Self-pay | Admitting: *Deleted

## 2012-09-16 ENCOUNTER — Encounter: Payer: Self-pay | Admitting: *Deleted

## 2013-05-27 ENCOUNTER — Encounter: Payer: Self-pay | Admitting: Family Medicine

## 2013-05-27 ENCOUNTER — Ambulatory Visit (INDEPENDENT_AMBULATORY_CARE_PROVIDER_SITE_OTHER): Payer: BC Managed Care – PPO | Admitting: Family Medicine

## 2013-05-27 VITALS — BP 110/70 | HR 74 | Temp 98.1°F | Resp 16 | Wt 178.0 lb

## 2013-05-27 DIAGNOSIS — B0230 Zoster ocular disease, unspecified: Secondary | ICD-10-CM | POA: Insufficient documentation

## 2013-05-27 NOTE — Progress Notes (Signed)
  Subjective:    Patient ID: Suzanne Taylor, female    DOB: [birth year], 42 y.o.   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  Woke up yesterday w/ vesicle on medial upper eye lid consistent w/ previous herpes outbreaks.  Today woke up w/ conjunctivitis, photophobia, excessive tearing.  'i usually let it run its course but now it looks like pink eye'.  Some blurry vision.   Review of Systems For ROS see HPI     Objective:   Physical Exam  Vitals reviewed. Constitutional: She appears well-developed and well-nourished. No distress.  HENT:  Head: Normocephalic and atraumatic.  Eyes: EOM are normal. Pupils are equal, round, and reactive to light. Right eye exhibits discharge (watery drainage, nonpurulent).  Marked R conjunctival injxn Small vesicle on R medial upper lid          Assessment & Plan:

## 2013-05-27 NOTE — Assessment & Plan Note (Signed)
New to provider, apparently ongoing for pt.  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  Will have pt emergently see eye doctor to determine appropriate course of tx.  Pt expressed understanding and is in agreement w/ plan.

## 2013-05-27 NOTE — Patient Instructions (Signed)
Go immediately to [facility]

## 2013-07-31 LAB — HM MAMMOGRAPHY: HM Mammogram: NORMAL

## 2013-08-14 ENCOUNTER — Other Ambulatory Visit: Payer: Self-pay

## 2013-08-14 DIAGNOSIS — Z1231 Encounter for screening mammogram for malignant neoplasm of breast: Secondary | ICD-10-CM

## 2013-09-01 ENCOUNTER — Ambulatory Visit
Admission: RE | Admit: 2013-09-01 | Discharge: 2013-09-01 | Disposition: A | Discharge status: Home / Self Care | Payer: BC Managed Care – PPO | Source: Ambulatory Visit

## 2013-09-01 DIAGNOSIS — Z1231 Encounter for screening mammogram for malignant neoplasm of breast: Secondary | ICD-10-CM

## 2013-12-11 ENCOUNTER — Telehealth: Payer: Self-pay

## 2013-12-11 NOTE — Telephone Encounter (Signed)
Medication and allergies:  Reviewed and updated  90 day supply/mail order: n/a Local pharmacy:  [facility] # [place], [place]   Immunizations due:  UTD   A/P: No changes to personal, family history or past surgical hx PAP- 02/13/12-negative CCS- 04/23/09- adenomatous polyp otw normal; repeat colonoscopy in 5 years (03/2014) MMG- 09/01/13- negative Tdap- 02/02/09  To Discuss with Provider: Nothing at this time.

## 2013-12-12 ENCOUNTER — Encounter: Payer: Self-pay | Admitting: General Practice

## 2013-12-12 ENCOUNTER — Encounter: Payer: Self-pay | Admitting: Family Medicine

## 2013-12-12 ENCOUNTER — Ambulatory Visit (INDEPENDENT_AMBULATORY_CARE_PROVIDER_SITE_OTHER): Payer: BC Managed Care – PPO | Admitting: Family Medicine

## 2013-12-12 VITALS — BP 108/72 | HR 83 | Temp 98.3°F | Resp 16 | Ht 69.5 in | Wt 169.1 lb

## 2013-12-12 DIAGNOSIS — E049 Nontoxic goiter, unspecified: Secondary | ICD-10-CM

## 2013-12-12 DIAGNOSIS — Z0000 Encounter for general adult medical examination without abnormal findings: Secondary | ICD-10-CM

## 2013-12-12 DIAGNOSIS — E039 Hypothyroidism, unspecified: Secondary | ICD-10-CM | POA: Insufficient documentation

## 2013-12-12 DIAGNOSIS — E010 Iodine-deficiency related diffuse (endemic) goiter: Secondary | ICD-10-CM

## 2013-12-12 LAB — CBC WITH DIFFERENTIAL/PLATELET
BASOS PCT: 0.8 % (ref 0.0–3.0)
Basophils Absolute: 0 10*3/uL (ref 0.0–0.1)
EOS ABS: 0.1 10*3/uL (ref 0.0–0.7)
EOS PCT: 1.9 % (ref 0.0–5.0)
HEMATOCRIT: 41.2 % (ref 36.0–46.0)
Hemoglobin: 13.9 g/dL (ref 12.0–15.0)
LYMPHS ABS: 1.8 10*3/uL (ref 0.7–4.0)
Lymphocytes Relative: 36.2 % (ref 12.0–46.0)
MCHC: 33.7 g/dL (ref 30.0–36.0)
MCV: 90.5 fl (ref 78.0–100.0)
MONO ABS: 0.4 10*3/uL (ref 0.1–1.0)
Monocytes Relative: 7.8 % (ref 3.0–12.0)
Neutro Abs: 2.7 10*3/uL (ref 1.4–7.7)
Neutrophils Relative %: 53.3 % (ref 43.0–77.0)
Platelets: 282 10*3/uL (ref 150.0–400.0)
RBC: 4.55 Mil/uL (ref 3.87–5.11)
RDW: 13.5 % (ref 11.5–15.5)
WBC: 5.1 10*3/uL (ref 4.0–10.5)

## 2013-12-12 LAB — LIPID PANEL
CHOL/HDL RATIO: 5
Cholesterol: 167 mg/dL (ref 0–200)
HDL: 36.2 mg/dL — ABNORMAL LOW (ref >39.00)
LDL CALC: 106 mg/dL — AB (ref 0–99)
TRIGLYCERIDES: 125 mg/dL (ref 0.0–149.0)
VLDL: 25 mg/dL (ref 0.0–40.0)

## 2013-12-12 LAB — HEPATIC FUNCTION PANEL
ALT: 11 U/L (ref 0–35)
AST: 19 U/L (ref 0–37)
Albumin: 3.7 g/dL (ref 3.5–5.2)
Alkaline Phosphatase: 36 U/L — ABNORMAL LOW (ref 39–117)
BILIRUBIN DIRECT: 0.1 mg/dL (ref 0.0–0.3)
BILIRUBIN TOTAL: 0.7 mg/dL (ref 0.2–1.2)
Total Protein: 6.8 g/dL (ref 6.0–8.3)

## 2013-12-12 LAB — BASIC METABOLIC PANEL
BUN: 9 mg/dL (ref 6–23)
CHLORIDE: 108 meq/L (ref 96–112)
CO2: 24 meq/L (ref 19–32)
CREATININE: 1 mg/dL (ref 0.4–1.2)
Calcium: 9.2 mg/dL (ref 8.4–10.5)
GFR: 63.69 mL/min (ref >60.00)
Glucose, Bld: 84 mg/dL (ref 70–99)
Potassium: 4.4 mEq/L (ref 3.5–5.1)
SODIUM: 140 meq/L (ref 135–145)

## 2013-12-12 LAB — TSH: TSH: 2.55 u[IU]/mL (ref 0.35–4.50)

## 2013-12-12 NOTE — Progress Notes (Signed)
Pre visit review using our clinic review tool, if applicable. No additional management support is needed unless otherwise documented below in the visit note. 

## 2013-12-12 NOTE — Assessment & Plan Note (Signed)
New.  Get [place] to assess.

## 2013-12-12 NOTE — Progress Notes (Signed)
   Subjective:    Patient ID: Suzanne Taylor, female    DOB: 1971-04-09, 43 y.o.   MRN: [id number]  HPI CPE- UTD on pap, mammo.  Had colonoscopy 2010 due to family hx- due for repeat this year (September)   Review of Systems Patient reports no vision/ hearing changes, adenopathy,fever, weight change,  persistant/recurrent hoarseness , swallowing issues, chest pain, palpitations, edema, persistant/recurrent cough, hemoptysis, dyspnea (rest/exertional/paroxysmal nocturnal), gastrointestinal bleeding (melena, rectal bleeding), abdominal pain, significant heartburn, bowel changes, GU symptoms (dysuria, hematuria, incontinence), Gyn symptoms (abnormal  bleeding, pain),  syncope, focal weakness, memory loss, numbness & tingling, skin/hair/nail changes, abnormal bruising or bleeding, anxiety, or depression.     Objective:   Physical Exam General Appearance:    Alert, cooperative, no distress, appears stated age  Head:    Normocephalic, without obvious abnormality, atraumatic  Eyes:    PERRL, conjunctiva/corneas clear, EOM's intact, fundi    benign, both eyes  Ears:    Normal TM's and external ear canals, both ears  Nose:   Nares normal, septum midline, mucosa normal, no drainage    or sinus tenderness  Throat:   Lips, mucosa, and tongue normal; teeth and gums normal  Neck:   Supple, symmetrical, trachea midline, no adenopathy;    Thyroid: diffuse fullness w/ possible inferior nodule  Back:     Symmetric, no curvature, ROM normal, no CVA tenderness  Lungs:     Clear to auscultation bilaterally, respirations unlabored  Chest Wall:    No tenderness or deformity   Heart:    Regular rate and rhythm, S1 and S2 normal, no murmur, rub   or gallop  Breast Exam:    Deferred to GYN  Abdomen:     Soft, non-tender, bowel sounds active all four quadrants,    no masses, no organomegaly  Genitalia:    Deferred to GYN  Rectal:    Extremities:   Extremities normal, atraumatic, no cyanosis or edema  Pulses:    2+ and symmetric all extremities  Skin:   Skin color, texture, turgor normal, no rashes or lesions  Lymph nodes:   Cervical, supraclavicular, and axillary nodes normal  Neurologic:   CNII-XII intact, normal strength, sensation and reflexes    throughout          Assessment & Plan:

## 2013-12-12 NOTE — Assessment & Plan Note (Signed)
PE WNL.  UTD on GYN.  Check labs.  Anticipatory guidance provided.

## 2013-12-12 NOTE — Patient Instructions (Signed)
Follow up in 1 year or as needed We'll notify you of your lab results and make any changes if needed We'll call you with your [place] appt Call with any questions or concerns Keep up the good work!  You look great! Happy Summer!!!

## 2013-12-17 ENCOUNTER — Ambulatory Visit (HOSPITAL_BASED_OUTPATIENT_CLINIC_OR_DEPARTMENT_OTHER)
Admission: RE | Admit: 2013-12-17 | Discharge: 2013-12-17 | Disposition: A | Discharge status: Home / Self Care | Payer: BC Managed Care – PPO | Source: Ambulatory Visit | Attending: Family Medicine | Admitting: Family Medicine

## 2013-12-17 DIAGNOSIS — R599 Enlarged lymph nodes, unspecified: Secondary | ICD-10-CM | POA: Insufficient documentation

## 2013-12-17 DIAGNOSIS — E049 Nontoxic goiter, unspecified: Secondary | ICD-10-CM | POA: Insufficient documentation

## 2013-12-17 DIAGNOSIS — E010 Iodine-deficiency related diffuse (endemic) goiter: Secondary | ICD-10-CM

## 2013-12-18 ENCOUNTER — Telehealth: Payer: Self-pay | Admitting: Family Medicine

## 2013-12-18 ENCOUNTER — Encounter: Payer: Self-pay | Admitting: General Practice

## 2013-12-18 LAB — VITAMIN D 1,25 DIHYDROXY
Vitamin D 1, 25 (OH)2 Total: 62 pg/mL (ref 18–72)
Vitamin D2 1, 25 (OH)2: <8 pg/mL
Vitamin D3 1, 25 (OH)2: 62 pg/mL

## 2013-12-18 NOTE — Telephone Encounter (Signed)
Pt notified of lab results

## 2013-12-18 NOTE — Telephone Encounter (Signed)
Caller name: Tyreka  Call back number: [phone]   Reason for call:  Returning call

## 2014-03-28 ENCOUNTER — Encounter: Payer: Self-pay | Admitting: Internal Medicine

## 2014-06-10 ENCOUNTER — Encounter: Payer: Self-pay | Admitting: Internal Medicine

## 2014-08-06 ENCOUNTER — Encounter: Payer: Self-pay | Admitting: Internal Medicine

## 2014-09-25 ENCOUNTER — Ambulatory Visit (AMBULATORY_SURGERY_CENTER): Payer: Self-pay

## 2014-09-25 VITALS — Ht 69.0 in | Wt 179.0 lb

## 2014-09-25 DIAGNOSIS — Z860100 Personal history of colon polyps, unspecified: Secondary | ICD-10-CM

## 2014-09-25 NOTE — Progress Notes (Signed)
No allergies to eggs or soy No diet/weight loss meds No home oxygen No past problems with anesthesia (PONV with general)  Has email  Emmi instructions given for colonoscopy

## 2014-10-09 ENCOUNTER — Encounter: Payer: Self-pay | Admitting: Internal Medicine

## 2014-10-09 ENCOUNTER — Ambulatory Visit (AMBULATORY_SURGERY_CENTER): Payer: BLUE CROSS/BLUE SHIELD | Admitting: Internal Medicine

## 2014-10-09 VITALS — BP 112/69 | HR 65 | Temp 99.1°F | Resp 14 | Ht 69.0 in | Wt 179.0 lb

## 2014-10-09 DIAGNOSIS — Z8371 Family history of colonic polyps: Secondary | ICD-10-CM

## 2014-10-09 DIAGNOSIS — Z86010 Personal history of colonic polyps: Secondary | ICD-10-CM

## 2014-10-09 MED ORDER — SODIUM CHLORIDE 0.9 % IV SOLN
500.0000 mL | INTRAVENOUS | Status: DC
Start: 2014-10-09 — End: 2014-10-09

## 2014-10-09 NOTE — Op Note (Signed)
[place]  [address] [place], [address]   COLONOSCOPY PROCEDURE REPORT  PATIENT: Suzanne Taylor, Suzanne Taylor  MR#: #[id number] BIRTHDATE: 1971-03-16 , 75  yrs. old GENDER: female ENDOSCOPIST: Gatha Mayer, MD, [facility] PROCEDURE DATE:  10/09/2014 PROCEDURE:   Colonoscopy, surveillance First Screening Colonoscopy - Avg.  risk and is 50 yrs.  old or older - No.  Prior Negative Screening - Now for repeat screening. N/A  History of Adenoma - Now for follow-up colonoscopy & has been > or = to 3 yrs.  Yes hx of adenoma.  Has been 3 or more years since last colonoscopy. ASA CLASS:   Class II INDICATIONS:Surveillance due to prior colonic neoplasia, PH Colon Adenoma, and FH Colon Adenoma. MEDICATIONS: Propofol 300 mg IV and Monitored anesthesia care  DESCRIPTION OF PROCEDURE:   After the risks benefits and alternatives of the procedure were thoroughly explained, informed consent was obtained.  The digital rectal exam revealed no abnormalities of the rectum.   The LB [id number]  endoscope was introduced through the anus and advanced to the cecum, which was identified by both the appendix and ileocecal valve. No adverse events experienced.   The quality of the prep was adequate (MiraLax was used)  The instrument was then slowly withdrawn as the colon was fully examined.      COLON FINDINGS: A normal appearing cecum, ileocecal valve, and appendiceal orifice were identified.  The ascending, transverse, descending, sigmoid colon, and rectum appeared unremarkable. Retroflexed views revealed no abnormalities. The time to cecum = 6.3 Withdrawal time = 10.9   The scope was withdrawn and the procedure completed. COMPLICATIONS: There were no immediate complications.  ENDOSCOPIC IMPRESSION: Normal colonoscopy  RECOMMENDATIONS: Repeat Colonoscopy in 5 years - given PH adenoma and FH adenomas  eSigned:  Gatha Mayer, MD, [facility] 10/09/2014 2:18 PM   cc: The  Patient

## 2014-10-09 NOTE — Progress Notes (Signed)
Report to PACU, RN, vss, BBS= Clear.  

## 2014-10-09 NOTE — Patient Instructions (Signed)
YOU HAD AN ENDOSCOPIC PROCEDURE TODAY AT [facility]:   Refer to the procedure report that was given to you for any specific questions about what was found during the examination.  If the procedure report does not answer your questions, please call your gastroenterologist to clarify.  If you requested that your care partner not be given the details of your procedure findings, then the procedure report has been included in a sealed envelope for you to review at your convenience later.  YOU SHOULD EXPECT: Some feelings of bloating in the abdomen. Passage of more gas than usual.  Walking can help get rid of the air that was put into your GI tract during the procedure and reduce the bloating. If you had a lower endoscopy (such as a colonoscopy or flexible sigmoidoscopy) you may notice spotting of blood in your stool or on the toilet paper. If you underwent a bowel prep for your procedure, you may not have a normal bowel movement for a few days.  Please Note:  You might notice some irritation and congestion in your nose or some drainage.  This is from the oxygen used during your procedure.  There is no need for concern and it should clear up in a day or so.  SYMPTOMS TO REPORT IMMEDIATELY:   Following lower endoscopy (colonoscopy or flexible sigmoidoscopy):  Excessive amounts of blood in the stool  Significant tenderness or worsening of abdominal pains  Swelling of the abdomen that is new, acute  Fever of 100F or higher   For urgent or emergent issues, a gastroenterologist can be reached at any hour by calling [phone].   DIET: Your first meal following the procedure should be a small meal and then it is ok to progress to your normal diet. Heavy or fried foods are harder to digest and may make you feel nauseous or bloated.  Likewise, meals heavy in dairy and vegetables can increase bloating.  Drink plenty of fluids but you should avoid alcoholic beverages for 24  hours.  ACTIVITY:  You should plan to take it easy for the rest of today and you should NOT DRIVE or use heavy machinery until tomorrow (because of the sedation medicines used during the test).    FOLLOW UP: Our staff will call the number listed on your records the next business day following your procedure to check on you and address any questions or concerns that you may have regarding the information given to you following your procedure. If we do not reach you, we will leave a message.  However, if you are feeling well and you are not experiencing any problems, there is no need to return our call.  We will assume that you have returned to your regular daily activities without incident.  If any biopsies were taken you will be contacted by phone or by letter within the next 1-3 weeks.  Please call us at [phone] if you have not heard about the biopsies in 3 weeks.    SIGNATURES/CONFIDENTIALITY: You and/or your care partner have signed paperwork which will be entered into your electronic medical record.  These signatures attest to the fact that that the information above on your After Visit Summary has been reviewed and is understood.  Full responsibility of the confidentiality of this discharge information lies with you and/or your care-partner.  Return 5 years-2021

## 2014-10-12 ENCOUNTER — Telehealth: Payer: Self-pay

## 2014-10-12 NOTE — Telephone Encounter (Signed)
  Follow up Call-  Call back number 10/09/2014  Post procedure Call Back phone  # [phone]  Permission to leave phone message Yes     Patient questions:  Do you have a fever, pain , or abdominal swelling? No. Pain Score  0 *  Have you tolerated food without any problems? Yes.    Have you been able to return to your normal activities? Yes.    Do you have any questions about your discharge instructions: Diet   No. Medications  No. Follow up visit  No.  Do you have questions or concerns about your Care? No.  Actions: * If pain score is 4 or above: No action needed, pain <4.

## 2014-11-02 ENCOUNTER — Other Ambulatory Visit: Payer: Self-pay

## 2014-11-02 DIAGNOSIS — Z1231 Encounter for screening mammogram for malignant neoplasm of breast: Secondary | ICD-10-CM

## 2014-11-05 ENCOUNTER — Ambulatory Visit
Admission: RE | Admit: 2014-11-05 | Discharge: 2014-11-05 | Disposition: A | Discharge status: Home / Self Care | Payer: BLUE CROSS/BLUE SHIELD | Source: Ambulatory Visit

## 2014-11-05 DIAGNOSIS — Z1231 Encounter for screening mammogram for malignant neoplasm of breast: Secondary | ICD-10-CM

## 2015-01-04 ENCOUNTER — Telehealth: Payer: Self-pay | Admitting: Family Medicine

## 2015-01-04 NOTE — Telephone Encounter (Signed)
PCP first available is in October pt requesting CPE with another provider please advise if this is ok.

## 2015-01-04 NOTE — Telephone Encounter (Signed)
There should not be an urgency to pt having physical as this is supposed to be a well visit.  We can add her to a cancellation list and call her if an appt is available but it is not appropriate to see another provider for her physical

## 2015-01-05 NOTE — Telephone Encounter (Signed)
There was a cancellation pt scheduled for 01/20/2015.

## 2015-01-05 NOTE — Telephone Encounter (Signed)
Pt ca be scheduled for a Thyroid follow up.

## 2015-01-05 NOTE — Telephone Encounter (Signed)
Pt was placed on waiting list. Pt states her concern is having her thyroid, cholesterol and labs done. Please advise

## 2015-01-19 ENCOUNTER — Telehealth: Payer: Self-pay

## 2015-01-19 NOTE — Telephone Encounter (Signed)
Left a message for call back.  Pap- 02/13/12 (will be due next month) Tdap-02/02/09 CCS- 10/09/14- normal, repeat in 5 yrs- Dr. Carlean Purl MMg- 05/06/10 DUE

## 2015-01-20 ENCOUNTER — Encounter: Payer: Self-pay | Admitting: Family Medicine

## 2015-01-20 ENCOUNTER — Ambulatory Visit (INDEPENDENT_AMBULATORY_CARE_PROVIDER_SITE_OTHER): Payer: BLUE CROSS/BLUE SHIELD | Admitting: Family Medicine

## 2015-01-20 VITALS — BP 114/72 | HR 85 | Temp 98.2°F | Resp 16 | Ht 69.0 in | Wt 180.4 lb

## 2015-01-20 DIAGNOSIS — Z0000 Encounter for general adult medical examination without abnormal findings: Secondary | ICD-10-CM

## 2015-01-20 NOTE — Progress Notes (Signed)
   Subjective:    Patient ID: Suzanne Taylor, female    DOB: 03-Jul-1971, 44 y.o.   MRN: [id number]  HPI CPE- UTD on pap Raphael Gibney), mammo, colonoscopy Carlean Purl)   Review of Systems Patient reports no vision/ hearing changes, adenopathy,fever, weight change,  persistant/recurrent hoarseness , swallowing issues, chest pain, palpitations, edema, persistant/recurrent cough, hemoptysis, dyspnea (rest/exertional/paroxysmal nocturnal), gastrointestinal bleeding (melena, rectal bleeding), abdominal pain, significant heartburn, bowel changes, GU symptoms (dysuria, hematuria, incontinence), Gyn symptoms (abnormal  bleeding, pain),  syncope, focal weakness, memory loss, numbness & tingling, skin/hair/nail changes, abnormal bruising or bleeding, anxiety, or depression.     Objective:   Physical Exam General Appearance:    Alert, cooperative, no distress, appears stated age  Head:    Normocephalic, without obvious abnormality, atraumatic  Eyes:    PERRL, conjunctiva/corneas clear, EOM's intact, fundi    benign, both eyes  Ears:    Normal TM's and external ear canals, both ears  Nose:   Nares normal, septum midline, mucosa normal, no drainage    or sinus tenderness  Throat:   Lips, mucosa, and tongue normal; teeth and gums normal  Neck:   Supple, symmetrical, trachea midline, no adenopathy;    Thyroid: no enlargement/tenderness/nodules  Back:     Symmetric, no curvature, ROM normal, no CVA tenderness  Lungs:     Clear to auscultation bilaterally, respirations unlabored  Chest Wall:    No tenderness or deformity   Heart:    Regular rate and rhythm, S1 and S2 normal, no murmur, rub   or gallop  Breast Exam:    Deferred to GYN  Abdomen:     Soft, non-tender, bowel sounds active all four quadrants,    no masses, no organomegaly  Genitalia:    Deferred to GYN  Rectal:    Extremities:   Extremities normal, atraumatic, no cyanosis or edema  Pulses:   2+ and symmetric all extremities  Skin:   Skin color,  texture, turgor normal, no rashes or lesions  Lymph nodes:   Cervical, supraclavicular, and axillary nodes normal  Neurologic:   CNII-XII intact, normal strength, sensation and reflexes    throughout          Assessment & Plan:

## 2015-01-20 NOTE — Telephone Encounter (Signed)
Unable to reach patient prior to visit  

## 2015-01-20 NOTE — Progress Notes (Signed)
Pre visit review using our clinic review tool, if applicable. No additional management support is needed unless otherwise documented below in the visit note. 

## 2015-01-20 NOTE — Assessment & Plan Note (Signed)
Pt's PE WNL.  UTD on GYN and colonoscopy.  Check labs.  Anticipatory guidance provided.  

## 2015-01-20 NOTE — Patient Instructions (Signed)
Follow up in 1 year or as needed We'll notify you of your lab results and make any changes if needed Keep up the good work on healthy diet and regular exercise Call with any questions or concerns Have a great summer!!!

## 2015-01-21 ENCOUNTER — Encounter: Payer: Self-pay | Admitting: General Practice

## 2015-01-21 LAB — CBC WITH DIFFERENTIAL/PLATELET
BASOS PCT: 2.3 % (ref 0.0–3.0)
Basophils Absolute: 0.2 10*3/uL — ABNORMAL HIGH (ref 0.0–0.1)
Eosinophils Absolute: 0.1 10*3/uL (ref 0.0–0.7)
Eosinophils Relative: 1.6 % (ref 0.0–5.0)
HEMATOCRIT: 41 % (ref 36.0–46.0)
Hemoglobin: 13.8 g/dL (ref 12.0–15.0)
Lymphocytes Relative: 37.8 % (ref 12.0–46.0)
Lymphs Abs: 3 10*3/uL (ref 0.7–4.0)
MCHC: 33.6 g/dL (ref 30.0–36.0)
MCV: 90.3 fl (ref 78.0–100.0)
Monocytes Absolute: 0.5 10*3/uL (ref 0.1–1.0)
Monocytes Relative: 6.3 % (ref 3.0–12.0)
Neutro Abs: 4.1 10*3/uL (ref 1.4–7.7)
Neutrophils Relative %: 52 % (ref 43.0–77.0)
PLATELETS: 265 10*3/uL (ref 150.0–400.0)
RBC: 4.55 Mil/uL (ref 3.87–5.11)
RDW: 13.4 % (ref 11.5–15.5)
WBC: 7.9 10*3/uL (ref 4.0–10.5)

## 2015-01-21 LAB — HEPATIC FUNCTION PANEL
ALBUMIN: 4.1 g/dL (ref 3.5–5.2)
ALT: 11 U/L (ref 0–35)
AST: 17 U/L (ref 0–37)
Alkaline Phosphatase: 50 U/L (ref 39–117)
Bilirubin, Direct: 0.1 mg/dL (ref 0.0–0.3)
TOTAL PROTEIN: 7.2 g/dL (ref 6.0–8.3)
Total Bilirubin: 0.6 mg/dL (ref 0.2–1.2)

## 2015-01-21 LAB — BASIC METABOLIC PANEL
BUN: 11 mg/dL (ref 6–23)
CHLORIDE: 101 meq/L (ref 96–112)
CO2: 28 meq/L (ref 19–32)
Calcium: 9.6 mg/dL (ref 8.4–10.5)
Creatinine, Ser: 0.93 mg/dL (ref 0.40–1.20)
GFR: 68.91 mL/min (ref >60.00)
Glucose, Bld: 76 mg/dL (ref 70–99)
POTASSIUM: 4 meq/L (ref 3.5–5.1)
SODIUM: 136 meq/L (ref 135–145)

## 2015-01-21 LAB — LIPID PANEL
CHOL/HDL RATIO: 4
Cholesterol: 177 mg/dL (ref 0–200)
HDL: 44.4 mg/dL (ref >39.00)
LDL CALC: 110 mg/dL — AB (ref 0–99)
NONHDL: 132.6
Triglycerides: 112 mg/dL (ref 0.0–149.0)
VLDL: 22.4 mg/dL (ref 0.0–40.0)

## 2015-01-21 LAB — TSH: TSH: 3.28 u[IU]/mL (ref 0.35–4.50)

## 2015-01-21 LAB — VITAMIN D 25 HYDROXY (VIT D DEFICIENCY, FRACTURES): VITD: 44.45 ng/mL (ref 30.00–100.00)

## 2015-09-30 ENCOUNTER — Encounter: Payer: Self-pay | Admitting: Medical

## 2015-09-30 ENCOUNTER — Ambulatory Visit (INDEPENDENT_AMBULATORY_CARE_PROVIDER_SITE_OTHER): Payer: BLUE CROSS/BLUE SHIELD | Admitting: Medical

## 2015-09-30 VITALS — BP 116/76 | HR 78 | Temp 98.2°F | Ht 69.0 in | Wt 170.2 lb

## 2015-09-30 DIAGNOSIS — N390 Urinary tract infection, site not specified: Secondary | ICD-10-CM | POA: Diagnosis not present

## 2015-09-30 DIAGNOSIS — R8290 Unspecified abnormal findings in urine: Secondary | ICD-10-CM

## 2015-09-30 DIAGNOSIS — R82998 Other abnormal findings in urine: Secondary | ICD-10-CM

## 2015-09-30 LAB — POC URINALSYSI DIPSTICK (AUTOMATED)
BILIRUBIN UA: NEGATIVE
Glucose, UA: NEGATIVE
KETONES UA: NEGATIVE
PH UA: 6
SPEC GRAV UA: 1.02
Urobilinogen, UA: 0.2

## 2015-09-30 MED ORDER — NITROFURANTOIN MONOHYD MACRO 100 MG PO CAPS: 100.0000 mg | ORAL_CAPSULE | Freq: Two times a day (BID) | ORAL | Status: DC

## 2015-09-30 NOTE — Progress Notes (Signed)
Pre visit review using our clinic review tool, if applicable. No additional management support is needed unless otherwise documented below in the visit note. 

## 2015-09-30 NOTE — Patient Instructions (Signed)
Your appear to have a urinary tract infection . I am prescribing macrobid  antibiotic for the probable infection. Hydrate well. I am sending out a urine culture. During the interim if your signs and symptoms worsen rather than improving please notify us. We will notify your when the culture results are back.  Follow up in 7 days or as needed. 

## 2015-09-30 NOTE — Progress Notes (Signed)
Subjective:    Patient ID: Suzanne Taylor, female    DOB: 1971-02-18, 45 y.o.   MRN: [id number]  HPI  Pt in today reporting urinary symptoms for off and on for 2 wks.  Dysuria- no Frequent urination- no. Hesitancy-no. Suprapubic pressure-mild on and off. Fever-no chills-no Nausea-no Vomiting-no CVA pain-maybe faint sorenes on rt sie. History of UTI- Pt had some history of uti but 2-3 years ago. On kidney infection but not hosptialized. Gross hematuria- no Pt noticed some odor to odor today. In morning.  LMP- last weekend.   Review of Systems  Constitutional: Negative for fever, chills and fatigue.  Respiratory: Negative for cough, chest tightness, shortness of breath and wheezing.   Cardiovascular: Negative for chest pain and palpitations.  Gastrointestinal: Negative for abdominal pain.  Genitourinary: Positive for urgency. Negative for dysuria, frequency, flank pain and pelvic pain.       Odor to urine. Some suprapubic pressure.  Musculoskeletal: Positive for back pain.       Mild.  Hematological: Negative for adenopathy. Does not bruise/bleed easily.  Psychiatric/Behavioral: Negative for behavioral problems and confusion.   Past Medical History  Diagnosis Date  . PCOS (polycystic ovarian syndrome)   . Leg length inequality   . Colon polyps   . Endometrial polyp 03/24/04  . Irregular periods/menstrual cycles 03/24/04  . H/O varicella   . Yeast infection   . History of ovarian cyst   . Irregular periods/menstrual cycles 2005  . H/O fatigue 2005  . H/O amenorrhea 2008  . H/O constipation 2012  . Hx of adenomatous polyp of colon 04/23/2009    Social History   Social History  . Marital Status: Single    Spouse Name: N/A  . Number of Children: N/A  . Years of Education: N/A   Occupational History  . Not on file.   Social History Main Topics  . Smoking status: Never Smoker   . Smokeless tobacco: Never Used  . Alcohol Use: 0.0 oz/week    0 Standard drinks  or equivalent per week     Comment: less than monthly  . Drug Use: No  . Sexual Activity: Not on file   Other Topics Concern  . Not on file   Social History Narrative   Lives alone with dog.    Past Surgical History  Procedure Laterality Date  . Uterine polyps    . Colonoscopy w/ polypectomy    . Wisdom tooth extraction  12/2010  . Hysteroscopy w/d&c  03/24/2004  . Wrist cartilage repair  2006    Family History  Problem Relation Age of Onset  . Coronary artery disease Mother     CABG x4  . Colon polyps Mother     precancerous  . Heart disease Mother   . Stroke Father   . Hypertension Father   . Kidney disease Father   . Stroke Paternal Grandmother     Allergies  Allergen Reactions  . Ciprofloxacin Hives and Itching    Current Outpatient Prescriptions on File Prior to Visit  Medication Sig Dispense Refill  . MYZILRA tablet      No current facility-administered medications on file prior to visit.    BP 116/76 mmHg  Pulse 78  Temp(Src) 98.2 F (36.8 C) (Oral)  Ht 5\' 9"  (1.753 m)  Wt 170 lb 3.2 oz (77.202 kg)  BMI 25.12 kg/m2  SpO2 99%  LMP 09/23/2015      Objective:   Physical Exam   General Appearance- Not  in acute distress.  HEENT Eyes- Scleraeral/Conjuntiva-bilat- Not Yellow. Mouth & Throat- Normal.  Chest and Lung Exam Auscultation: Breath sounds:-Normal. Adventitious sounds:- No Adventitious sounds.  Cardiovascular Auscultation:Rythm - Regular. Heart Sounds -Normal heart sounds.  Abdomen Inspection:-Inspection Normal.  Palpation/Perucssion: Palpation and Percussion of the abdomen reveal- faint pressure at best, No Rebound tenderness, No rigidity(Guarding) and No Palpable abdominal masses.  Liver:-Normal.  Spleen:- Normal.   Back- no cva tenderness       Assessment & Plan:  Your appear to have a urinary tract infection. I am prescribing macrobid antibiotic for the probable infection. Hydrate well. I am sending out a urine culture.  During the interim if your signs and symptoms worsen rather than improving please notify us. We will notify your when the culture results are back.  Follow up in 7 days or as needed.

## 2015-10-02 LAB — URINE CULTURE: Colony Count: 25000

## 2015-10-03 ENCOUNTER — Telehealth: Payer: Self-pay | Admitting: Medical

## 2015-10-03 NOTE — Telephone Encounter (Signed)
Opened for review. 

## 2015-10-29 ENCOUNTER — Ambulatory Visit (HOSPITAL_BASED_OUTPATIENT_CLINIC_OR_DEPARTMENT_OTHER)
Admission: RE | Admit: 2015-10-29 | Discharge: 2015-10-29 | Disposition: A | Discharge status: Home / Self Care | Payer: BLUE CROSS/BLUE SHIELD | Source: Ambulatory Visit | Attending: Family Medicine | Admitting: Family Medicine

## 2015-10-29 ENCOUNTER — Ambulatory Visit (INDEPENDENT_AMBULATORY_CARE_PROVIDER_SITE_OTHER): Payer: BLUE CROSS/BLUE SHIELD | Admitting: Family Medicine

## 2015-10-29 ENCOUNTER — Other Ambulatory Visit: Payer: Self-pay | Admitting: Family Medicine

## 2015-10-29 VITALS — BP 108/68 | HR 84 | Temp 98.2°F | Wt 165.8 lb

## 2015-10-29 DIAGNOSIS — R102 Pelvic and perineal pain: Secondary | ICD-10-CM

## 2015-10-29 DIAGNOSIS — R300 Dysuria: Secondary | ICD-10-CM | POA: Diagnosis not present

## 2015-10-29 DIAGNOSIS — M25551 Pain in right hip: Secondary | ICD-10-CM

## 2015-10-29 DIAGNOSIS — R82998 Other abnormal findings in urine: Secondary | ICD-10-CM

## 2015-10-29 DIAGNOSIS — M25552 Pain in left hip: Secondary | ICD-10-CM | POA: Insufficient documentation

## 2015-10-29 DIAGNOSIS — N390 Urinary tract infection, site not specified: Secondary | ICD-10-CM | POA: Diagnosis not present

## 2015-10-29 LAB — POC URINALSYSI DIPSTICK (AUTOMATED)
BILIRUBIN UA: NEGATIVE
GLUCOSE UA: NEGATIVE
Ketones, UA: NEGATIVE
NITRITE UA: NEGATIVE
Spec Grav, UA: 1.015
Urobilinogen, UA: 2
pH, UA: 8

## 2015-10-29 MED ORDER — NITROFURANTOIN MONOHYD MACRO 100 MG PO CAPS: 100.0000 mg | ORAL_CAPSULE | Freq: Two times a day (BID) | ORAL | Status: DC

## 2015-10-29 NOTE — Progress Notes (Signed)
Subjective:    Patient ID: Suzanne Taylor, female    DOB: 22-Aug-1970, 45 y.o.   MRN: [id number]  Chief Complaint  Patient presents with  . Hip Pain    left hip pain x's 1 month  . Pelvic Pain    x's 1 and odor whern urinating    Hip Pain   Pelvic Pain The patient's primary symptoms include pelvic pain. Associated symptoms include joint pain. Pertinent negatives include no abdominal pain, back pain, chills, constipation, diarrhea, dysuria, fever, frequency, headaches, hematuria, nausea, rash, urgency or vomiting.   Patient is in today for left  hip pain with rotation or movement.   No known injury but she has been doing a lot of zumba etc.    Past Medical History  Diagnosis Date  . PCOS (polycystic ovarian syndrome)   . Leg length inequality   . Colon polyps   . Endometrial polyp 03/24/04  . Irregular periods/menstrual cycles 03/24/04  . H/O varicella   . Yeast infection   . History of ovarian cyst   . Irregular periods/menstrual cycles 2005  . H/O fatigue 2005  . H/O amenorrhea 2008  . H/O constipation 2012  . Hx of adenomatous polyp of colon 04/23/2009    Past Surgical History  Procedure Laterality Date  . Uterine polyps    . Colonoscopy w/ polypectomy    . Wisdom tooth extraction  12/2010  . Hysteroscopy w/d&c  03/24/2004  . Wrist cartilage repair  2006    Family History  Problem Relation Age of Onset  . Coronary artery disease Mother     CABG x4  . Colon polyps Mother     precancerous  . Heart disease Mother   . Stroke Father   . Hypertension Father   . Kidney disease Father   . Stroke Paternal Grandmother     Social History   Social History  . Marital Status: Single    Spouse Name: N/A  . Number of Children: N/A  . Years of Education: N/A   Occupational History  . Not on file.   Social History Main Topics  . Smoking status: Never Smoker   . Smokeless tobacco: Never Used  . Alcohol Use: 0.0 oz/week    0 Standard drinks or equivalent per week   Comment: less than monthly  . Drug Use: No  . Sexual Activity: Not on file   Other Topics Concern  . Not on file   Social History Narrative   Lives alone with dog.    Outpatient Prescriptions Prior to Visit  Medication Sig Dispense Refill  . MYZILRA tablet     . nitrofurantoin, macrocrystal-monohydrate, (MACROBID) 100 MG capsule Take 1 capsule (100 mg total) by mouth 2 (two) times daily. 14 capsule 0   No facility-administered medications prior to visit.    Allergies  Allergen Reactions  . Ciprofloxacin Hives and Itching    Review of Systems  Constitutional: Negative for fever, chills and malaise/fatigue.  HENT: Negative for congestion and hearing loss.   Eyes: Negative for discharge.  Respiratory: Negative for cough, sputum production and shortness of breath.   Cardiovascular: Negative for chest pain, palpitations and leg swelling.  Gastrointestinal: Negative for heartburn, nausea, vomiting, abdominal pain, diarrhea, constipation and blood in stool.  Genitourinary: Positive for pelvic pain. Negative for dysuria, urgency, frequency and hematuria.  Musculoskeletal: Positive for joint pain. Negative for myalgias, back pain and falls.       Hip pain  Skin: Negative for rash.  Neurological: Negative for dizziness, sensory change, loss of consciousness, weakness and headaches.  Endo/Heme/Allergies: Negative for environmental allergies. Does not bruise/bleed easily.  Psychiatric/Behavioral: Negative for depression and suicidal ideas. The patient is not nervous/anxious and does not have insomnia.        Objective:    Physical Exam  Constitutional: She is oriented to person, place, and time. She appears well-developed and well-nourished.  HENT:  Head: Normocephalic and atraumatic.  Eyes: Conjunctivae and EOM are normal.  Neck: Normal range of motion. Neck supple. No JVD present. Carotid bruit is not present. No thyromegaly present.  Cardiovascular: Normal rate, regular rhythm  and normal heart sounds.   No murmur heard. Pulmonary/Chest: Effort normal and breath sounds normal. No respiratory distress. She has no wheezes. She has no rales. She exhibits no tenderness.  Musculoskeletal: She exhibits edema and tenderness.       Left hip: She exhibits tenderness. She exhibits normal range of motion and normal strength.  Neurological: She is alert and oriented to person, place, and time.  Psychiatric: She has a normal mood and affect.  Nursing note and vitals reviewed.   BP 108/68 mmHg  Pulse 84  Temp(Src) 98.2 F (36.8 C) (Oral)  Wt 165 lb 12.8 oz (75.206 kg)  SpO2 99%  LMP 09/23/2015 Wt Readings from Last 3 Encounters:  10/29/15 165 lb 12.8 oz (75.206 kg)  09/30/15 170 lb 3.2 oz (77.202 kg)  01/20/15 180 lb 6 oz (81.818 kg)     Lab Results  Component Value Date   WBC 7.9 01/20/2015   HGB 13.8 01/20/2015   HCT 41.0 01/20/2015   PLT 265.0 01/20/2015   GLUCOSE 76 01/20/2015   CHOL 177 01/20/2015   TRIG 112.0 01/20/2015   HDL 44.40 01/20/2015   LDLCALC 110* 01/20/2015   ALT 11 01/20/2015   AST 17 01/20/2015   NA 136 01/20/2015   K 4.0 01/20/2015   CL 101 01/20/2015   CREATININE 0.93 01/20/2015   BUN 11 01/20/2015   CO2 28 01/20/2015   TSH 3.28 01/20/2015    Lab Results  Component Value Date   TSH 3.28 01/20/2015   Lab Results  Component Value Date   WBC 7.9 01/20/2015   HGB 13.8 01/20/2015   HCT 41.0 01/20/2015   MCV 90.3 01/20/2015   PLT 265.0 01/20/2015   Lab Results  Component Value Date   NA 136 01/20/2015   K 4.0 01/20/2015   CO2 28 01/20/2015   GLUCOSE 76 01/20/2015   BUN 11 01/20/2015   CREATININE 0.93 01/20/2015   BILITOT 0.6 01/20/2015   ALKPHOS 50 01/20/2015   AST 17 01/20/2015   ALT 11 01/20/2015   PROT 7.2 01/20/2015   ALBUMIN 4.1 01/20/2015   CALCIUM 9.6 01/20/2015   GFR 68.91 01/20/2015   Lab Results  Component Value Date   CHOL 177 01/20/2015   Lab Results  Component Value Date   HDL 44.40 01/20/2015    Lab Results  Component Value Date   LDLCALC 110* 01/20/2015   Lab Results  Component Value Date   TRIG 112.0 01/20/2015   Lab Results  Component Value Date   CHOLHDL 4 01/20/2015   No results found for: HGBA1C     Assessment & Plan:   Problem List Items Addressed This Visit      Unprioritized   Dysuria    Check urine culture abx per orders       Other Visit Diagnoses    Pelvic pain in female    -  Primary    Relevant Orders    POCT Urinalysis Dipstick (Automated) (Completed)    Urine culture    AMB referral to sports medicine    Leukocytes in urine        Relevant Orders    Urine culture    Right hip pain        Relevant Orders    AMB referral to sports medicine     pt did not want rx for any antiinflammatory or pain meds.  I am having Ms. Spielmann maintain her MYZILRA and nitrofurantoin (macrocrystal-monohydrate).  Meds ordered this encounter  Medications  . nitrofurantoin, macrocrystal-monohydrate, (MACROBID) 100 MG capsule    Sig: Take 1 capsule (100 mg total) by mouth 2 (two) times daily.    Dispense:  14 capsule    Refill:  0     Ann Held, DO

## 2015-10-29 NOTE — Progress Notes (Signed)
Pre visit review using our clinic review tool, if applicable. No additional management support is needed unless otherwise documented below in the visit note. 

## 2015-10-29 NOTE — Patient Instructions (Signed)

## 2015-10-30 ENCOUNTER — Encounter: Payer: Self-pay | Admitting: Family Medicine

## 2015-10-30 DIAGNOSIS — R300 Dysuria: Secondary | ICD-10-CM | POA: Insufficient documentation

## 2015-10-30 NOTE — Assessment & Plan Note (Signed)
Check urine culture abx per orders

## 2015-11-01 LAB — URINE CULTURE: Colony Count: 80000

## 2015-11-03 ENCOUNTER — Ambulatory Visit: Payer: BLUE CROSS/BLUE SHIELD | Admitting: Family Medicine

## 2015-11-04 ENCOUNTER — Ambulatory Visit (INDEPENDENT_AMBULATORY_CARE_PROVIDER_SITE_OTHER): Payer: BLUE CROSS/BLUE SHIELD | Admitting: Family Medicine

## 2015-11-04 ENCOUNTER — Encounter: Payer: Self-pay | Admitting: Family Medicine

## 2015-11-04 VITALS — BP 119/79 | HR 76 | Ht 69.0 in | Wt 164.0 lb

## 2015-11-04 DIAGNOSIS — M25552 Pain in left hip: Secondary | ICD-10-CM

## 2015-11-04 NOTE — Patient Instructions (Signed)
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 Talk to physical therapists about strengthening safely - I would try to avoid extremes of motion especially external rotation and abduction. Icing, nsaids only if needed. Follow up with me in 6 weeks unless you feel completely better.

## 2015-11-05 DIAGNOSIS — M25552 Pain in left hip: Secondary | ICD-10-CM | POA: Insufficient documentation

## 2015-11-05 NOTE — Progress Notes (Signed)
PCP and consultation requested by: Annye Asa, MD  Subjective:   HPI: Patient is a 45 y.o. female here for left hip pain.  Patient reports she's had several months of left hip pain off and on. Would get this after zumba class for example, would take 2-3 days to resolve. Having a soreness now. A lot of popping. Feels like the femur gets jammed when injuries, also feels like joint is too loose. Pain level currently 1/10, dull deep lateral hip. No radiation. No numbness or tingling. No back or groin pain. Pain with prolonged sitting. No swelling or bruising.  Past Medical History  Diagnosis Date  . PCOS (polycystic ovarian syndrome)   . Leg length inequality   . Colon polyps   . Endometrial polyp 03/24/04  . Irregular periods/menstrual cycles 03/24/04  . H/O varicella   . Yeast infection   . History of ovarian cyst   . Irregular periods/menstrual cycles 2005  . H/O fatigue 2005  . H/O amenorrhea 2008  . H/O constipation 2012  . Hx of adenomatous polyp of colon 04/23/2009    Current Outpatient Prescriptions on File Prior to Visit  Medication Sig Dispense Refill  . MYZILRA tablet     . nitrofurantoin, macrocrystal-monohydrate, (MACROBID) 100 MG capsule Take 1 capsule (100 mg total) by mouth 2 (two) times daily. 14 capsule 0   No current facility-administered medications on file prior to visit.    Past Surgical History  Procedure Laterality Date  . Uterine polyps    . Colonoscopy w/ polypectomy    . Wisdom tooth extraction  12/2010  . Hysteroscopy w/d&c  03/24/2004  . Wrist cartilage repair  2006    Allergies  Allergen Reactions  . Ciprofloxacin Hives and Itching    Social History   Social History  . Marital Status: Single    Spouse Name: N/A  . Number of Children: N/A  . Years of Education: N/A   Occupational History  . Not on file.   Social History Main Topics  . Smoking status: Never Smoker   . Smokeless tobacco: Never Used  . Alcohol Use: 0.0  oz/week    0 Standard drinks or equivalent per week     Comment: less than monthly  . Drug Use: No  . Sexual Activity: Not on file   Other Topics Concern  . Not on file   Social History Narrative   Lives alone with dog.    Family History  Problem Relation Age of Onset  . Coronary artery disease Mother     CABG x4  . Colon polyps Mother     precancerous  . Heart disease Mother   . Stroke Father   . Hypertension Father   . Kidney disease Father   . Stroke Paternal Grandmother     BP 119/79 mmHg  Pulse 76  Ht 5\' 9"  (1.753 m)  Wt 164 lb (74.39 kg)  BMI 24.21 kg/m2  Review of Systems: See HPI above.    Objective:  Physical Exam:  Gen: NAD, comfortable in exam room  Back: No gross deformity, scoliosis. No focal TTP.  No midline or bony TTP. FROM without pain Strength LEs 5/5 all muscle groups.   2+ MSRs in patellar and achilles tendons, equal bilaterally. Negative SLRs. Sensation intact to light touch bilaterally.  Left hip: Negative logroll bilateral hips - minimal pain at full ER, abduction. Negative fabers and piriformis stretches.    Assessment & Plan:  1. 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  Her history would be consistent with this as well.  We discussed snapping hip syndrome, labral tear as less likely possibilities.  She will start physical therapy for this, avoid painful activities, extreme extents of ER and abduction.  Icing, nsaids only if needed.  Consider MRI or MR arthrogram if not improving.  F/u in 6 weeks or prn.

## 2015-11-05 NOTE — Assessment & Plan Note (Signed)
independently reviewed radiographs - it appears compared to right hip that she does have a mild deformity of pelvis suggesting pincer type impingement.  Her history would be consistent with this as well.  We discussed snapping hip syndrome, labral tear as less likely possibilities.  She will start physical therapy for this, avoid painful activities, extreme extents of ER and abduction.  Icing, nsaids only if needed.  Consider MRI or MR arthrogram if not improving.  F/u in 6 weeks or prn.

## 2016-03-30 ENCOUNTER — Other Ambulatory Visit: Payer: Self-pay | Admitting: Obstetrics and Gynecology

## 2017-01-25 ENCOUNTER — Other Ambulatory Visit: Payer: Self-pay | Admitting: Obstetrics and Gynecology

## 2017-01-25 DIAGNOSIS — Z1231 Encounter for screening mammogram for malignant neoplasm of breast: Secondary | ICD-10-CM

## 2017-02-02 ENCOUNTER — Ambulatory Visit
Admission: RE | Admit: 2017-02-02 | Discharge: 2017-02-02 | Disposition: A | Discharge status: Home / Self Care | Payer: BLUE CROSS/BLUE SHIELD | Source: Ambulatory Visit | Attending: Obstetrics and Gynecology | Admitting: Obstetrics and Gynecology

## 2017-02-02 DIAGNOSIS — Z1231 Encounter for screening mammogram for malignant neoplasm of breast: Secondary | ICD-10-CM

## 2017-03-06 LAB — HM PAP SMEAR

## 2017-03-15 ENCOUNTER — Encounter: Payer: Self-pay | Admitting: General Practice

## 2017-03-21 ENCOUNTER — Telehealth: Payer: Self-pay | Admitting: Family Medicine

## 2017-03-21 NOTE — Telephone Encounter (Signed)
Ok to switch 

## 2017-03-21 NOTE — Telephone Encounter (Signed)
Pt would like to transition care from [facility] to [facility]. Pt says that [facility] is to far and she's heard good things about [facility].  Is this switch okay?

## 2017-03-21 NOTE — Telephone Encounter (Signed)
OK w me.  

## 2017-03-22 NOTE — Telephone Encounter (Signed)
Pt has been scheduled.  °

## 2017-03-27 ENCOUNTER — Encounter: Payer: Self-pay | Admitting: General Practice

## 2017-03-30 ENCOUNTER — Encounter: Payer: Self-pay | Admitting: Family Medicine

## 2017-03-30 ENCOUNTER — Ambulatory Visit (INDEPENDENT_AMBULATORY_CARE_PROVIDER_SITE_OTHER): Payer: BLUE CROSS/BLUE SHIELD | Admitting: Family Medicine

## 2017-03-30 VITALS — BP 110/80 | HR 93 | Temp 98.2°F | Ht 69.0 in | Wt 177.1 lb

## 2017-03-30 DIAGNOSIS — R946 Abnormal results of thyroid function studies: Secondary | ICD-10-CM | POA: Diagnosis not present

## 2017-03-30 DIAGNOSIS — R7989 Other specified abnormal findings of blood chemistry: Secondary | ICD-10-CM

## 2017-03-30 NOTE — Progress Notes (Signed)
Pre visit review using our clinic review tool, if applicable. No additional management support is needed unless otherwise documented below in the visit note. 

## 2017-03-30 NOTE — Progress Notes (Signed)
Chief Complaint  Patient presents with  . Follow-up    TSH lab elevated    Subjective: Patient is a 46 y.o. female here for TSH elevation.  03/06/17 4.59 TSH w OB/GYN. MGM had thyroid disorder, unsure specifically which type. Feels puffy, unexpected 10 lb weight gain from last year, constipation, fatigue, lethargy. Denies heat/cold intolerance, masses in the neck, voice changes, skin dryness, brittle hair, irreg vag bleeding, or slow heartbeat.  ROS: Skin: No dry skin Endo: As noted in HPI   Family History  Problem Relation Age of Onset  . Stroke Father   . Hypertension Father   . Kidney disease Father   . Coronary artery disease Mother        CABG x4  . Colon polyps Mother        precancerous  . Heart disease Mother   . Stroke Paternal Grandmother    Past Medical History:  Diagnosis Date  . Colon polyps   . Endometrial polyp 03/24/04  . H/O amenorrhea 2008  . H/O constipation 2012  . H/O fatigue 2005  . H/O varicella   . History of ovarian cyst   . Hx of adenomatous polyp of colon 04/23/2009  . Irregular periods/menstrual cycles 03/24/04  . Irregular periods/menstrual cycles 2005  . Leg length inequality   . PCOS (polycystic ovarian syndrome)   . Yeast infection    Allergies  Allergen Reactions  . Ciprofloxacin Hives and Itching    Current Outpatient Prescriptions:  Marland Kitchen  MYZILRA tablet, , Disp: , Rfl:   Objective: BP 110/80 (BP Location: Left Arm, Patient Position: Sitting, Cuff Size: Normal)   Pulse 93   Temp 98.2 F (36.8 C) (Oral)   Ht 5\' 9"  (1.753 m)   Wt 177 lb 2 oz (80.3 kg)   SpO2 98%   BMI 26.16 kg/m  General: Awake, appears stated age HEENT: MMM, EOMi Neck: No masses/nodules, symmetric Heart: RRR, no LE edema Lungs: CTAB, no rales, wheezes or rhonchi. No accessory muscle use Abd: BS+, soft, NT, ND, no masses or organomegaly Neuro: 2/4 patellar reflex b/l, 1/4 biceps and calcaneal reflex b/l, no clonus Psych: Age appropriate judgment and insight,  normal affect and mood  Assessment and Plan: Elevated TSH - Plan: TSH, T4, free  Orders as above. If free T4 is low or on the low end of normal, will try 25 g of Synthroid and recheck in 6 weeks. If everything is normal, will have her follow-up to discuss fatigue issues at her earliest convenience. The patient voiced understanding and agreement to the plan.  [place], DO 03/30/17  5:04 PM

## 2017-03-30 NOTE — Patient Instructions (Signed)
Try MiraLAX 1-2 times daily over the next 3-4 days. If no improvement, try using an enema. Stay well hydrated and keep lots of fiber in your diet.  I will let you know what your results are. If low or on the low end of normal, we can try a low dose replacement and recheck in 6 weeks. This will be communicated to you via MyChart.  Give [place] 2-3 business days to get the results of your labs back. If labs are normal, you will likely receive a letter in the mail unless you have MyChart. This can take longer than 2-3 business days.

## 2017-03-31 LAB — TSH: TSH: 4.19 mIU/L

## 2017-03-31 LAB — T4, FREE: Free T4: 1 ng/dL (ref 0.8–1.8)

## 2017-04-02 ENCOUNTER — Other Ambulatory Visit: Payer: Self-pay | Admitting: Family Medicine

## 2017-04-02 MED ORDER — LEVOTHYROXINE SODIUM 25 MCG PO TABS: 25.0000 ug | ORAL_TABLET | Freq: Every day | ORAL | 1 refills | Status: DC

## 2017-04-02 NOTE — Progress Notes (Signed)
Low dose thyroid replacement called in as detailed by result note. Pt notified via [facility].

## 2017-05-16 ENCOUNTER — Encounter: Payer: Self-pay | Admitting: Family Medicine

## 2017-05-16 ENCOUNTER — Ambulatory Visit (INDEPENDENT_AMBULATORY_CARE_PROVIDER_SITE_OTHER): Payer: BLUE CROSS/BLUE SHIELD | Admitting: Family Medicine

## 2017-05-16 VITALS — BP 110/64 | HR 80 | Temp 98.5°F | Ht 69.0 in | Wt 178.1 lb

## 2017-05-16 DIAGNOSIS — E039 Hypothyroidism, unspecified: Secondary | ICD-10-CM | POA: Diagnosis not present

## 2017-05-16 NOTE — Progress Notes (Signed)
Pre visit review using our clinic review tool, if applicable. No additional management support is needed unless otherwise documented below in the visit note. 

## 2017-05-16 NOTE — Progress Notes (Signed)
Chief Complaint  Patient presents with  . Medication Refill    Subjective: Patient is a 46 y.o. female here for thyroid follow up.  Hypothyroidism Reports compliance with medication- Synthroid 25 mcg daily. Current symptoms include: fatigue and constipation Denies: swelling, losing hair, anxiousness, feeling excessive energy and palpitations She believes her dose should be unchanged pending results  ROS: Heart: Denies palpitations  Family History  Problem Relation Age of Onset  . Stroke Father   . Hypertension Father   . Kidney disease Father   . Coronary artery disease Mother        CABG x4  . Colon polyps Mother        precancerous  . Heart disease Mother   . Stroke Paternal Grandmother    Past Medical History:  Diagnosis Date  . Colon polyps   . Endometrial polyp 03/24/04  . H/O amenorrhea 2008  . H/O constipation 2012  . H/O fatigue 2005  . H/O varicella   . History of ovarian cyst   . Hx of adenomatous polyp of colon 04/23/2009  . Irregular periods/menstrual cycles 03/24/04  . Irregular periods/menstrual cycles 2005  . Leg length inequality   . PCOS (polycystic ovarian syndrome)   . Yeast infection    Allergies  Allergen Reactions  . Ciprofloxacin Hives and Itching    Current Outpatient Prescriptions:  .  levothyroxine (SYNTHROID, LEVOTHROID) 25 MCG tablet, Take 1 tablet (25 mcg total) by mouth daily before breakfast., Disp: 30 tablet, Rfl: 1 .  MYZILRA tablet, , Disp: , Rfl:   Objective: BP 110/64 (BP Location: Left Arm, Patient Position: Sitting, Cuff Size: Normal)   Pulse 80   Temp 98.5 F (36.9 C) (Oral)   Ht 5\' 9"  (1.753 m)   Wt 178 lb 2 oz (80.8 kg)   SpO2 98%   BMI 26.30 kg/m  General: Awake, appears stated age HEENT: MMM, EOMi Neck: No masses or asymmetry Heart: RRR, no murmurs Lungs: CTAB, no rales, wheezes or rhonchi. No accessory muscle use Abd: BS+, soft, NT, ND, no masses or organomegaly Psych: Age appropriate judgment and insight,  normal affect and mood  Assessment and Plan: Hypothyroidism, unspecified type - Plan: TSH, T4, free  Orders as above. Cont current dose until we see results. If still on lower end of normal, will increase by 12.5 mcg. F/u in 6 weeks if changes made, 3 mo if stable.  The patient voiced understanding and agreement to the plan.  [place], DO 05/16/17  3:46 PM

## 2017-05-16 NOTE — Patient Instructions (Addendum)
I will send you your results with follow up recommendations.   Let us know if you need anything.

## 2017-05-17 LAB — TSH: TSH: 2.24 u[IU]/mL (ref 0.35–4.50)

## 2017-05-17 LAB — T4, FREE: FREE T4: 0.69 ng/dL (ref 0.60–1.60)

## 2017-05-18 ENCOUNTER — Other Ambulatory Visit: Payer: Self-pay | Admitting: Family Medicine

## 2017-05-18 MED ORDER — LEVOTHYROXINE SODIUM 50 MCG PO TABS: 50.0000 ug | ORAL_TABLET | Freq: Every day | ORAL | 0 refills | Status: DC

## 2017-05-18 NOTE — Progress Notes (Signed)
New dose of thyroid medication called in. Pt notified via [place].

## 2017-06-29 ENCOUNTER — Encounter: Payer: Self-pay | Admitting: Family Medicine

## 2017-06-29 ENCOUNTER — Ambulatory Visit: Payer: BLUE CROSS/BLUE SHIELD | Admitting: Family Medicine

## 2017-06-29 VITALS — BP 100/70 | HR 103 | Temp 98.1°F | Ht 69.0 in | Wt 178.1 lb

## 2017-06-29 DIAGNOSIS — E039 Hypothyroidism, unspecified: Secondary | ICD-10-CM

## 2017-06-29 NOTE — Progress Notes (Signed)
Chief Complaint  Patient presents with  . Follow-up    increase in synthroid    Subjective: Patient is a 46 y.o. female here for thyroid f/u.  Hypothyroidism Patient presents for follow-up of hypothyroidism.  Reports compliance with medication- synthroid 50 mcg daily. Current symptoms include: denies fatigue, weight changes, heat/cold intolerance, bowel/skin changes or CVS symptoms She believes her dose should be unchanged  ROS: Endo: As noted in HPI   Past Medical History:  Diagnosis Date  . Colon polyps   . Endometrial polyp 03/24/04  . H/O amenorrhea 2008  . H/O constipation 2012  . H/O fatigue 2005  . H/O varicella   . History of ovarian cyst   . Hx of adenomatous polyp of colon 04/23/2009  . Irregular periods/menstrual cycles 03/24/04  . Irregular periods/menstrual cycles 2005  . Leg length inequality   . PCOS (polycystic ovarian syndrome)   . Yeast infection    Objective: BP 100/70 (BP Location: Left Arm, Patient Position: Sitting, Cuff Size: Large)   Pulse (!) 103   Temp 98.1 F (36.7 C) (Oral)   Ht 5\' 9"  (1.753 m)   Wt 178 lb 2 oz (80.8 kg)   SpO2 97%   BMI 26.30 kg/m  General: Awake, appears stated age HEENT: MMM, EOMi Heart: RRR, no LE edema Lungs: CTAB, no rales, wheezes or rhonchi. No accessory muscle use Neck: Soft, supple symmetric, no thyromegaly Psych: Age appropriate judgment and insight, normal affect and mood  Assessment and Plan: Hypothyroidism, unspecified type - Plan: TSH, T4, free  Orders as above. Cont current dose, doing well. F/u in 6 mo pending above.  The patient voiced understanding and agreement to the plan.  [place], DO 06/29/17  3:11 PM

## 2017-06-29 NOTE — Progress Notes (Signed)
Pre visit review using our clinic review tool, if applicable. No additional management support is needed unless otherwise documented below in the visit note. 

## 2017-06-29 NOTE — Patient Instructions (Signed)
Let us know if you need anything.  

## 2017-06-30 LAB — TSH: TSH: 2.03 mIU/L

## 2017-06-30 LAB — T4, FREE: FREE T4: 1.1 ng/dL (ref 0.8–1.8)

## 2017-08-12 ENCOUNTER — Other Ambulatory Visit: Payer: Self-pay | Admitting: Family Medicine

## 2017-10-10 ENCOUNTER — Encounter: Payer: Self-pay | Admitting: Family Medicine

## 2017-10-10 ENCOUNTER — Ambulatory Visit: Payer: BLUE CROSS/BLUE SHIELD | Admitting: Family Medicine

## 2017-10-10 VITALS — BP 100/70 | HR 88 | Temp 98.4°F | Ht 69.0 in | Wt 179.4 lb

## 2017-10-10 DIAGNOSIS — E039 Hypothyroidism, unspecified: Secondary | ICD-10-CM

## 2017-10-10 NOTE — Progress Notes (Signed)
Chief Complaint  Patient presents with  . Fatigue    problem with nails  . Constipation  . Alopecia    Subjective: Patient is a 47 y.o. female here for hypothyroid.  Hypothyroidism Patient presents for follow-up of hypothyroidism.  Reports compliance with medication. Current symptoms include: fatigue, constipation and losing hair. Denies: weight gain, feeling cold and cold intolerance, swelling, anxiousness, feeling excessive energy, sweating and weight loss She believes her dose should be increased  ROS: Skin: +itchy skin GI: +constipation    Past Medical History:  Diagnosis Date  . Colon polyps   . Endometrial polyp 03/24/04  . H/O amenorrhea 2008  . H/O constipation 2012  . H/O fatigue 2005  . H/O varicella   . History of ovarian cyst   . Hx of adenomatous polyp of colon 04/23/2009  . Irregular periods/menstrual cycles 03/24/04  . Irregular periods/menstrual cycles 2005  . Leg length inequality   . PCOS (polycystic ovarian syndrome)   . Yeast infection    Allergies  Allergen Reactions  . Ciprofloxacin Hives and Itching    Current Outpatient Medications:  .  levothyroxine (SYNTHROID, LEVOTHROID) 50 MCG tablet, TAKE 1 TABLET BY MOUTH EVERY DAY, Disp: 90 tablet, Rfl: 0 .  MYZILRA tablet, , Disp: , Rfl:   Objective: BP 100/70 (BP Location: Left Arm, Patient Position: Sitting, Cuff Size: Normal)   Pulse 88   Temp 98.4 F (36.9 C) (Oral)   Ht 5\' 9"  (1.753 m)   Wt 179 lb 6 oz (81.4 kg)   SpO2 97%   BMI 26.49 kg/m  General: Awake, appears stated age HEENT: MMM, EOMi Neck: Symmetric, no masses or nodules appreciated Heart: RRR Lungs: CTAB, no rales, wheezes or rhonchi. No accessory muscle use Abd: BS+, soft, NT, ND, no masses or organomegaly Psych: Age appropriate judgment and insight, normal affect and mood  Assessment and Plan: Hypothyroidism, unspecified type - Plan: T4, free, TSH, CBC  Orders as above. Counseled on diet and exercise. If on lower end  of nml, will increase dose. Reck in 6 weeks. The patient voiced understanding and agreement to the plan.  [place], DO 10/10/17  4:22 PM

## 2017-10-10 NOTE — Progress Notes (Signed)
Pre visit review using our clinic review tool, if applicable. No additional management support is needed unless otherwise documented below in the visit note. 

## 2017-10-10 NOTE — Patient Instructions (Signed)
1-2 days to get the results of your labs back.   If it is on the lower end of normal, we will increase your dose  Let us know if you need anything.

## 2017-10-11 ENCOUNTER — Other Ambulatory Visit: Payer: Self-pay | Admitting: Family Medicine

## 2017-10-11 LAB — CBC
HCT: 39.8 % (ref 36.0–46.0)
HEMOGLOBIN: 13.2 g/dL (ref 12.0–15.0)
MCHC: 33.3 g/dL (ref 30.0–36.0)
MCV: 90.1 fl (ref 78.0–100.0)
Platelets: 261 10*3/uL (ref 150.0–400.0)
RBC: 4.41 Mil/uL (ref 3.87–5.11)
RDW: 13.1 % (ref 11.5–15.5)
WBC: 6.3 10*3/uL (ref 4.0–10.5)

## 2017-10-11 LAB — TSH: TSH: 3.35 u[IU]/mL (ref 0.35–4.50)

## 2017-10-11 LAB — T4, FREE: FREE T4: 0.84 ng/dL (ref 0.60–1.60)

## 2017-10-11 MED ORDER — LEVOTHYROXINE SODIUM 75 MCG PO TABS: 75.0000 ug | ORAL_TABLET | Freq: Every day | ORAL | 3 refills | Status: DC

## 2017-10-11 NOTE — Progress Notes (Signed)
Increasing dose of synthroid form 50 mcg to 75 mcg/d.

## 2017-11-03 ENCOUNTER — Other Ambulatory Visit: Payer: Self-pay | Admitting: Family Medicine

## 2017-11-21 ENCOUNTER — Ambulatory Visit: Payer: BLUE CROSS/BLUE SHIELD | Admitting: Family Medicine

## 2017-11-21 ENCOUNTER — Encounter: Payer: Self-pay | Admitting: Family Medicine

## 2017-11-21 VITALS — BP 120/64 | HR 86 | Temp 98.1°F | Ht 69.0 in | Wt 181.0 lb

## 2017-11-21 DIAGNOSIS — L659 Nonscarring hair loss, unspecified: Secondary | ICD-10-CM

## 2017-11-21 DIAGNOSIS — E039 Hypothyroidism, unspecified: Secondary | ICD-10-CM

## 2017-11-21 DIAGNOSIS — R5383 Other fatigue: Secondary | ICD-10-CM

## 2017-11-21 DIAGNOSIS — R002 Palpitations: Secondary | ICD-10-CM | POA: Diagnosis not present

## 2017-11-21 NOTE — Progress Notes (Signed)
Chief Complaint  Patient presents with  . Follow-up    lab work    Subjective: Hypothyroidism Patient presents for follow-up of hypothyroidism.  Reports compliance with medication. Current symptoms include: fatigue, weight gain, constipation, feeling slow, losing hair and palpitations Denies: feeling cold and cold intolerance, swelling, anxiousness, feeling excessive energy, tremulousness, sweating and weight loss She believes her dose should be increased pending above. Denies CP, SOB or lightheadedness. Palpitations have been going on for several weeks, worse when she lies down. Lasts for a few seconds, no assoc s/s's. Staying well hydrated. Seems to be happening less freq lately.   ROS: Heart: Denies chest pain, +palpitations Lungs: Denies SOB   Family History  Problem Relation Age of Onset  . Stroke Father   . Hypertension Father   . Kidney disease Father   . Coronary artery disease Mother        CABG x4  . Colon polyps Mother        precancerous  . Heart disease Mother   . Stroke Paternal Grandmother    Past Medical History:  Diagnosis Date  . Colon polyps   . Endometrial polyp 03/24/04  . H/O amenorrhea 2008  . H/O constipation 2012  . H/O fatigue 2005  . H/O varicella   . History of ovarian cyst   . Hx of adenomatous polyp of colon 04/23/2009  . Irregular periods/menstrual cycles 03/24/04  . Irregular periods/menstrual cycles 2005  . Leg length inequality   . PCOS (polycystic ovarian syndrome)   . Yeast infection    Allergies  Allergen Reactions  . Ciprofloxacin Hives and Itching    Current Outpatient Medications:  .  levothyroxine (SYNTHROID, LEVOTHROID) 75 MCG tablet, Take 1 tablet (75 mcg total) by mouth daily., Disp: 30 tablet, Rfl: 3 .  MYZILRA tablet, , Disp: , Rfl:   Objective: BP 120/64 (BP Location: Left Arm, Patient Position: Sitting, Cuff Size: Normal)   Pulse 86   Temp 98.1 F (36.7 C) (Oral)   Ht 5\' 9"  (1.753 m)   Wt 181 lb (82.1 kg)    SpO2 96%   BMI 26.73 kg/m  General: Awake, appears stated age HEENT: MMM, EOMi Neck: Supple, no asymmetry, nodules Heart: RRR, no LE edema Lungs: CTAB, no rales, wheezes or rhonchi. No accessory muscle use Abd: BS+, soft, NT, ND, no masses or organomegaly Psych: Age appropriate judgment and insight, normal affect and mood  Assessment and Plan: Hypothyroidism, unspecified type - Plan: TSH, T4, free  Hair loss - Plan: CBC, Ferritin, IBC panel  Other fatigue - Plan: CBC, Comprehensive metabolic panel  Palpitations - Plan: CBC, Comprehensive metabolic panel, Magnesium  Orders as above. Will plan to treat to optimal T4 levels to see if her s/s's improve.  Ck Fe, CBC and Mg given palpitations and hair issues. Offered EKG and Holter, but given her lack of risk factors, stated it likely would not be helpful.  F/u pending above.  The patient voiced understanding and agreement to the plan.  [place], DO 11/21/17  4:14 PM

## 2017-11-21 NOTE — Patient Instructions (Addendum)
If you start having chest pain, shortness of breath, or lightheadedness associated with the palpitations, seek care or let me know right away.  Let's hold off on follow up appointment pending your labs.  Keep the diet clean and stay active. Consider lifting weights.   Let us know if you need anything.

## 2017-11-21 NOTE — Progress Notes (Signed)
Pre visit review using our clinic review tool, if applicable. No additional management support is needed unless otherwise documented below in the visit note. 

## 2017-11-22 ENCOUNTER — Encounter: Payer: Self-pay | Admitting: Family Medicine

## 2017-11-22 LAB — COMPREHENSIVE METABOLIC PANEL
ALT: 11 U/L (ref 0–35)
AST: 15 U/L (ref 0–37)
Albumin: 4 g/dL (ref 3.5–5.2)
Alkaline Phosphatase: 47 U/L (ref 39–117)
BUN: 12 mg/dL (ref 6–23)
CO2: 27 meq/L (ref 19–32)
Calcium: 9.6 mg/dL (ref 8.4–10.5)
Chloride: 103 mEq/L (ref 96–112)
Creatinine, Ser: 0.94 mg/dL (ref 0.40–1.20)
GFR: 67.25 mL/min (ref >60.00)
GLUCOSE: 84 mg/dL (ref 70–99)
Potassium: 4.7 mEq/L (ref 3.5–5.1)
Sodium: 139 mEq/L (ref 135–145)
Total Bilirubin: 0.3 mg/dL (ref 0.2–1.2)
Total Protein: 7.1 g/dL (ref 6.0–8.3)

## 2017-11-22 LAB — CBC
HEMATOCRIT: 41.6 % (ref 36.0–46.0)
HEMOGLOBIN: 13.8 g/dL (ref 12.0–15.0)
MCHC: 33.2 g/dL (ref 30.0–36.0)
MCV: 90.9 fl (ref 78.0–100.0)
Platelets: 296 10*3/uL (ref 150.0–400.0)
RBC: 4.58 Mil/uL (ref 3.87–5.11)
RDW: 12.8 % (ref 11.5–15.5)
WBC: 6.4 10*3/uL (ref 4.0–10.5)

## 2017-11-22 LAB — FERRITIN: Ferritin: 39.4 ng/mL (ref 10.0–291.0)

## 2017-11-22 LAB — MAGNESIUM: Magnesium: 2 mg/dL (ref 1.5–2.5)

## 2017-11-22 LAB — T4, FREE: FREE T4: 0.81 ng/dL (ref 0.60–1.60)

## 2017-11-22 LAB — TSH: TSH: 1.25 u[IU]/mL (ref 0.35–4.50)

## 2017-11-22 LAB — IBC PANEL
Iron: 134 ug/dL (ref 42–145)
Saturation Ratios: 31.7 % (ref 20.0–50.0)
TRANSFERRIN: 302 mg/dL (ref 212.0–360.0)

## 2017-11-23 ENCOUNTER — Other Ambulatory Visit: Payer: Self-pay | Admitting: Family Medicine

## 2017-11-23 MED ORDER — LEVOTHYROXINE SODIUM 100 MCG PO TABS: 100.0000 ug | ORAL_TABLET | Freq: Every day | ORAL | 3 refills | Status: DC

## 2017-12-31 ENCOUNTER — Ambulatory Visit: Payer: BLUE CROSS/BLUE SHIELD | Admitting: Family Medicine

## 2017-12-31 ENCOUNTER — Encounter: Payer: Self-pay | Admitting: Family Medicine

## 2017-12-31 VITALS — BP 120/78 | HR 88 | Temp 98.3°F | Ht 69.0 in | Wt 181.0 lb

## 2017-12-31 DIAGNOSIS — E039 Hypothyroidism, unspecified: Secondary | ICD-10-CM

## 2017-12-31 DIAGNOSIS — L503 Dermatographic urticaria: Secondary | ICD-10-CM | POA: Diagnosis not present

## 2017-12-31 NOTE — Patient Instructions (Signed)
Claritin (loratadine), Allegra (fexofenadine), Zyrtec (cetirizine); these are listed in order from weakest to strongest. Generic, and therefore cheaper, options are in the parentheses.   There are available OTC, and the generic versions, which may be cheaper, are in parentheses. Show this to a pharmacist if you have trouble finding any of these items.  We will be in touch regarding the results.   OK to hold off on the iron for now.   Let us know if you need anything.

## 2017-12-31 NOTE — Progress Notes (Signed)
Pre visit review using our clinic review tool, if applicable. No additional management support is needed unless otherwise documented below in the visit note. 

## 2017-12-31 NOTE — Progress Notes (Signed)
Chief Complaint  Patient presents with  . Follow-up    Subjective: Patient is a 47 y.o. female here for f/u.  Hypothyroidism Patient presents for follow-up of hypothyroidism.  Recently increased dosage to 100 mcg Synthroid, finally feeling better. Fatigue improved, palpitations have been better, hair is thicker. She believes her dose should be changed depending on the results.   +itchy skin, hx of welting with itching.   ROS: Endo: As noted in HPI   Past Medical History:  Diagnosis Date  . Colon polyps   . Endometrial polyp 03/24/04  . H/O amenorrhea 2008  . H/O constipation 2012  . H/O fatigue 2005  . H/O varicella   . History of ovarian cyst   . Hx of adenomatous polyp of colon 04/23/2009  . Irregular periods/menstrual cycles 03/24/04  . Irregular periods/menstrual cycles 2005  . Leg length inequality   . PCOS (polycystic ovarian syndrome)   . Yeast infection    Objective: BP 120/78 (BP Location: Left Arm, Patient Position: Sitting, Cuff Size: Normal)   Pulse 88   Temp 98.3 F (36.8 C) (Oral)   Ht 5\' 9"  (1.753 m)   Wt 181 lb (82.1 kg)   SpO2 97%   BMI 26.73 kg/m  General: Awake, appears stated age Skin: +dermatographia on L arm Heart: RRR, no murmurs Lungs: CTAB, no rales, wheezes or rhonchi. No accessory muscle use Neck: No masses or thyromegaly appreciated, symmetric Psych: Age appropriate judgment and insight, normal affect and mood  Assessment and Plan: Hypothyroidism, unspecified type - Plan: TSH, T4, free  Dermatographia  Orders as above. Cont current dose for now, finally making some leeway.  OTC PO antihistamines rec'd for prn use for itchy skin. The patient voiced understanding and agreement to the plan.  [place], DO 12/31/17  4:01 PM

## 2018-01-01 LAB — TSH: TSH: 0.62 u[IU]/mL (ref 0.35–4.50)

## 2018-01-01 LAB — T4, FREE: Free T4: 0.97 ng/dL (ref 0.60–1.60)

## 2018-02-15 ENCOUNTER — Other Ambulatory Visit: Payer: Self-pay | Admitting: Family Medicine

## 2018-04-05 ENCOUNTER — Ambulatory Visit: Payer: BLUE CROSS/BLUE SHIELD | Admitting: Family Medicine

## 2018-05-16 ENCOUNTER — Encounter: Payer: Self-pay | Admitting: Family Medicine

## 2018-05-16 ENCOUNTER — Ambulatory Visit: Payer: BLUE CROSS/BLUE SHIELD | Admitting: Family Medicine

## 2018-05-16 VITALS — BP 108/70 | HR 87 | Temp 98.6°F | Ht 69.0 in | Wt 185.4 lb

## 2018-05-16 DIAGNOSIS — E039 Hypothyroidism, unspecified: Secondary | ICD-10-CM

## 2018-05-16 DIAGNOSIS — R002 Palpitations: Secondary | ICD-10-CM | POA: Diagnosis not present

## 2018-05-16 NOTE — Progress Notes (Signed)
Chief Complaint  Patient presents with  . Follow-up    Subjective: Patient is a 47 y.o. female here for f/u thyroid.  Reports compliance with medication-Synthroid 100 mcg daily. Current symptoms include: denies fatigue, weight changes, heat/cold intolerance, bowel/skin changes or CP/sob. +palpitations when she lays down. Difficult to gauge how long it lasts, lasting for several mo.  She believes her dose should be increased  ROS: Endo: As noted in HPI  Past Medical History:  Diagnosis Date  . Colon polyps   . Endometrial polyp 03/24/04  . H/O amenorrhea 2008  . H/O constipation 2012  . H/O fatigue 2005  . H/O varicella   . History of ovarian cyst   . Hx of adenomatous polyp of colon 04/23/2009  . Irregular periods/menstrual cycles 03/24/04  . Irregular periods/menstrual cycles 2005  . Leg length inequality   . PCOS (polycystic ovarian syndrome)   . Yeast infection     Objective: BP 108/70 (BP Location: Left Arm, Patient Position: Sitting, Cuff Size: Normal)   Pulse 87   Temp 98.6 F (37 C) (Oral)   Ht 5\' 9"  (1.753 m)   Wt 185 lb 6 oz (84.1 kg)   SpO2 95%   BMI 27.38 kg/m  General: Awake, appears stated age Neck: No thyromegaly or asymmetry Heart: RRR, no murmurs Lungs: CTAB, no rales, wheezes or rhonchi. No accessory muscle use Psych: Age appropriate judgment and insight, normal affect and mood  Assessment and Plan: Hypothyroidism, unspecified type - Plan: T4, free, TSH  Palpitations  Orders as above. Watchful waiting for palpitations. Ck thyroid levels. F/u in 6 mo or prn. The patient voiced understanding and agreement to the plan.  [place], DO 05/16/18  4:21 PM

## 2018-05-16 NOTE — Progress Notes (Signed)
Pre visit review using our clinic review tool, if applicable. No additional management support is needed unless otherwise documented below in the visit note. 

## 2018-05-16 NOTE — Patient Instructions (Signed)
Give us 2-3 business days to get the results of your labs back.  Let us know if you need anything.  

## 2018-05-17 LAB — T4, FREE: Free T4: 1.03 ng/dL (ref 0.60–1.60)

## 2018-05-17 LAB — TSH: TSH: 0.95 u[IU]/mL (ref 0.35–4.50)

## 2018-08-14 ENCOUNTER — Other Ambulatory Visit: Payer: Self-pay | Admitting: Family Medicine

## 2018-09-23 ENCOUNTER — Other Ambulatory Visit: Payer: Self-pay | Admitting: Orthopedic Surgery

## 2018-10-14 ENCOUNTER — Encounter (HOSPITAL_BASED_OUTPATIENT_CLINIC_OR_DEPARTMENT_OTHER): Payer: Self-pay | Admitting: *Deleted

## 2018-10-14 ENCOUNTER — Other Ambulatory Visit: Payer: Self-pay

## 2018-10-21 ENCOUNTER — Ambulatory Visit (HOSPITAL_BASED_OUTPATIENT_CLINIC_OR_DEPARTMENT_OTHER): Admit: 2018-10-21 | Payer: BLUE CROSS/BLUE SHIELD | Admitting: Orthopedic Surgery

## 2018-10-21 SURGERY — WRIST ARTHROSCOPY WITH DEBRIDEMENT
Anesthesia: General | Laterality: Right

## 2018-10-25 ENCOUNTER — Encounter: Payer: Self-pay | Admitting: Family Medicine

## 2018-10-25 ENCOUNTER — Ambulatory Visit: Payer: PRIVATE HEALTH INSURANCE | Admitting: Family Medicine

## 2018-10-25 ENCOUNTER — Other Ambulatory Visit: Payer: Self-pay

## 2018-10-25 VITALS — BP 120/68 | HR 93 | Temp 98.2°F | Ht 69.0 in | Wt 189.1 lb

## 2018-10-25 DIAGNOSIS — E039 Hypothyroidism, unspecified: Secondary | ICD-10-CM

## 2018-10-25 DIAGNOSIS — Z6827 Body mass index (BMI) 27.0-27.9, adult: Secondary | ICD-10-CM | POA: Diagnosis not present

## 2018-10-25 LAB — COMPREHENSIVE METABOLIC PANEL
ALT: 11 U/L (ref 0–35)
AST: 13 U/L (ref 0–37)
Albumin: 4.1 g/dL (ref 3.5–5.2)
Alkaline Phosphatase: 54 U/L (ref 39–117)
BUN: 10 mg/dL (ref 6–23)
CO2: 25 mEq/L (ref 19–32)
Calcium: 9.3 mg/dL (ref 8.4–10.5)
Chloride: 106 mEq/L (ref 96–112)
Creatinine, Ser: 0.92 mg/dL (ref 0.40–1.20)
GFR: 64.62 mL/min (ref >60.00)
Glucose, Bld: 76 mg/dL (ref 70–99)
POTASSIUM: 4.4 meq/L (ref 3.5–5.1)
Sodium: 140 mEq/L (ref 135–145)
Total Bilirubin: 0.6 mg/dL (ref 0.2–1.2)
Total Protein: 6.6 g/dL (ref 6.0–8.3)

## 2018-10-25 LAB — LIPID PANEL
Cholesterol: 186 mg/dL (ref 0–200)
HDL: 43.4 mg/dL (ref >39.00)
LDL CALC: 127 mg/dL — AB (ref 0–99)
NonHDL: 142.64
Total CHOL/HDL Ratio: 4
Triglycerides: 79 mg/dL (ref 0.0–149.0)
VLDL: 15.8 mg/dL (ref 0.0–40.0)

## 2018-10-25 LAB — TSH: TSH: 0.71 u[IU]/mL (ref 0.35–4.50)

## 2018-10-25 LAB — T4, FREE: Free T4: 1.13 ng/dL (ref 0.60–1.60)

## 2018-10-25 MED ORDER — LEVOTHYROXINE SODIUM 125 MCG PO TABS: 125.0000 ug | ORAL_TABLET | Freq: Every day | ORAL | 1 refills | Status: DC

## 2018-10-25 NOTE — Progress Notes (Signed)
Chief Complaint  Patient presents with  . Follow-up    thyroid    Subjective: Patient is a 48 y.o. female here for f/u thyroid levels.  We had been steadily increasing dosage of levothyroxine. Currently on 100 mcg. +wt gain, thinning hair, hypodefecation.   ROS: Endo: +weight gain Skin: +hair thinning  Past Medical History:  Diagnosis Date  . Colon polyps   . Endometrial polyp 03/24/04  . H/O amenorrhea 2008  . H/O constipation 2012  . H/O fatigue 2005  . H/O varicella   . History of ovarian cyst   . Hx of adenomatous polyp of colon 04/23/2009  . Irregular periods/menstrual cycles 03/24/04  . Irregular periods/menstrual cycles 2005  . Leg length inequality   . PCOS (polycystic ovarian syndrome)   . Yeast infection     Objective: BP 120/68 (BP Location: Left Arm, Patient Position: Sitting, Cuff Size: Normal)   Pulse 93   Temp 98.2 F (36.8 C) (Oral)   Ht 5\' 9"  (1.753 m)   Wt 189 lb 2 oz (85.8 kg)   SpO2 98%   BMI 27.93 kg/m  General: Awake, appears stated age Lungs: No accessory muscle use Psych: Age appropriate judgment and insight, normal affect and mood  Assessment and Plan: Hypothyroidism, unspecified type - Plan: T4, free, TSH, levothyroxine (SYNTHROID, LEVOTHROID) 125 MCG tablet  BMI 27.0-27.9,adult - Plan: Comprehensive metabolic panel, Lipid panel  Increase dose. Will aim to keep her in normal T4 range. Reck in 6 weeks.  The patient voiced understanding and agreement to the plan.  [place], DO 10/25/18  9:45 AM

## 2018-10-25 NOTE — Patient Instructions (Signed)
Give [place] 2-3 business days to get the results of your labs back.   Keep the diet clean and stay active.  [employer] is a free app for your phone. It is also a website.  Let us know if you need anything.

## 2018-11-14 ENCOUNTER — Ambulatory Visit: Payer: BLUE CROSS/BLUE SHIELD | Admitting: Family Medicine

## 2018-12-05 ENCOUNTER — Other Ambulatory Visit: Payer: Self-pay | Admitting: Orthopedic Surgery

## 2018-12-09 ENCOUNTER — Ambulatory Visit (INDEPENDENT_AMBULATORY_CARE_PROVIDER_SITE_OTHER): Payer: PRIVATE HEALTH INSURANCE | Admitting: Family Medicine

## 2018-12-09 ENCOUNTER — Other Ambulatory Visit: Payer: Self-pay | Admitting: Orthopedic Surgery

## 2018-12-09 ENCOUNTER — Other Ambulatory Visit: Payer: Self-pay

## 2018-12-09 ENCOUNTER — Encounter: Payer: Self-pay | Admitting: Family Medicine

## 2018-12-09 ENCOUNTER — Telehealth: Payer: Self-pay | Admitting: Family Medicine

## 2018-12-09 DIAGNOSIS — E039 Hypothyroidism, unspecified: Secondary | ICD-10-CM | POA: Diagnosis not present

## 2018-12-09 NOTE — Telephone Encounter (Signed)
LVM FOR PT TO CALL AND SCHEDULE LAB APPT

## 2018-12-09 NOTE — Progress Notes (Addendum)
Chief Complaint  Patient presents with  . Follow-up    Subjective: Hypothyroidism Patient presents for follow-up of hypothyroidism. Due to COVID-19 pandemic, we are interacting via web portal for an electronic face-to-face visit. I verified patient's ID using 2 identifiers. Patient agreed to proceed with visit via this method. Patient is at home, I am at office. Patient and I are present for visit.  Reports compliance with medication- levothyroxine 125 mcg/d. Current symptoms include: hair loss hasn't gotten worse, nor has wt gain, but was on abx that curbed appetite Denies: feeling cold and cold intolerance, swelling, feeling excessive energy and palpitations She believes her dose should be increased if able.  ROS: Endo: No wt gain  Past Medical History:  Diagnosis Date  . Colon polyps   . Endometrial polyp 03/24/04  . H/O amenorrhea 2008  . H/O constipation 2012  . H/O fatigue 2005  . H/O varicella   . History of ovarian cyst   . Hx of adenomatous polyp of colon 04/23/2009  . Irregular periods/menstrual cycles 03/24/04  . Irregular periods/menstrual cycles 2005  . Leg length inequality   . PCOS (polycystic ovarian syndrome)   . Yeast infection     Objective: No conversational dyspnea Age appropriate judgment and insight Nml affect and mood  Assessment and Plan: Hypothyroidism, unspecified type - Plan: T4, free, TSH  Ck above. If in lower 2/3 of normal, will increase dose. F/u in 6 weeks if we make a change, 6 mo if no change.  The patient voiced understanding and agreement to the plan.  [place], DO 12/09/18  3:17 PM

## 2018-12-10 ENCOUNTER — Other Ambulatory Visit: Payer: Self-pay

## 2018-12-10 ENCOUNTER — Other Ambulatory Visit (INDEPENDENT_AMBULATORY_CARE_PROVIDER_SITE_OTHER): Payer: PRIVATE HEALTH INSURANCE

## 2018-12-10 DIAGNOSIS — E039 Hypothyroidism, unspecified: Secondary | ICD-10-CM | POA: Diagnosis not present

## 2018-12-11 ENCOUNTER — Other Ambulatory Visit: Payer: Self-pay | Admitting: Family Medicine

## 2018-12-11 DIAGNOSIS — R7989 Other specified abnormal findings of blood chemistry: Secondary | ICD-10-CM

## 2018-12-11 LAB — TSH: TSH: 0.2 u[IU]/mL — ABNORMAL LOW (ref 0.35–4.50)

## 2018-12-11 LAB — T4, FREE: Free T4: 1.18 ng/dL (ref 0.60–1.60)

## 2018-12-11 MED ORDER — LEVOTHYROXINE SODIUM 137 MCG PO TABS: 137.0000 ug | ORAL_TABLET | Freq: Every day | ORAL | 3 refills | Status: DC

## 2018-12-16 ENCOUNTER — Ambulatory Visit (HOSPITAL_BASED_OUTPATIENT_CLINIC_OR_DEPARTMENT_OTHER): Admit: 2018-12-16 | Payer: PRIVATE HEALTH INSURANCE | Admitting: Orthopedic Surgery

## 2018-12-30 ENCOUNTER — Other Ambulatory Visit: Payer: Self-pay

## 2018-12-30 ENCOUNTER — Encounter (HOSPITAL_BASED_OUTPATIENT_CLINIC_OR_DEPARTMENT_OTHER): Payer: Self-pay | Admitting: *Deleted

## 2019-01-02 ENCOUNTER — Other Ambulatory Visit (HOSPITAL_COMMUNITY)
Admission: RE | Admit: 2019-01-02 | Discharge: 2019-01-02 | Disposition: A | Discharge status: Home / Self Care | Payer: PRIVATE HEALTH INSURANCE | Source: Ambulatory Visit | Attending: Orthopedic Surgery | Admitting: Orthopedic Surgery

## 2019-01-02 ENCOUNTER — Other Ambulatory Visit: Payer: Self-pay

## 2019-01-02 ENCOUNTER — Encounter (HOSPITAL_BASED_OUTPATIENT_CLINIC_OR_DEPARTMENT_OTHER)
Admission: RE | Admit: 2019-01-02 | Discharge: 2019-01-02 | Disposition: A | Discharge status: Home / Self Care | Payer: PRIVATE HEALTH INSURANCE | Source: Ambulatory Visit | Attending: Orthopedic Surgery | Admitting: Orthopedic Surgery

## 2019-01-02 DIAGNOSIS — Z1159 Encounter for screening for other viral diseases: Secondary | ICD-10-CM | POA: Insufficient documentation

## 2019-01-02 LAB — CBC
HCT: 40.9 % (ref 36.0–46.0)
Hemoglobin: 13.9 g/dL (ref 12.0–15.0)
MCH: 30.2 pg (ref 26.0–34.0)
MCHC: 34 g/dL (ref 30.0–36.0)
MCV: 88.7 fL (ref 80.0–100.0)
Platelets: 261 10*3/uL (ref 150–400)
RBC: 4.61 MIL/uL (ref 3.87–5.11)
RDW: 13.1 % (ref 11.5–15.5)
WBC: 6.6 10*3/uL (ref 4.0–10.5)
nRBC: 0 % (ref 0.0–0.2)

## 2019-01-02 NOTE — H&P (Signed)
Suzanne Taylor is an 48 y.o. female.   CC / Reason for Visit: Right wrist pain HPI: This patient returns to clinic today indicating that the ECU injection during the lidocaine phase was not all that significantly helpful.  She states that the pain that she feels in her wrist is mostly intermitent and feels like a pop with a quick sharp pain.   HPI 08/06/2018: This patient presents for reevaluation, having undergone a radiocarpal injection of the right wrist on 12 5 and 5-19.  She continues to work without restriction.  She really cannot recall exactly how she fell during the lidocaine phase, but does not recall feeling better.  It is her characterization today that the injection did not help her wrist pain much if at all.  She is not much for taking oral medications but does have Voltaren gel to rely upon.  Additionally, her pain is not constant, but is aggravated by certain activities that may not be always the same.  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  She states that on 03/15/2018 she was supporting the patient who missed a step going upstairs.  She had to catch him with a gait belt and ever since then has had ulnar-sided right wrist pain.  She saw Dr. Berenice Primas who evaluated her and treated her with a radiocarpal injection on 03/25/2018 which offered her some minor relief as well as an ulnar-sided radiocarpal injection which also offered a small amount of relief.  The patient declines any type of anti-inflammatory medication as she is not a, "pill taker".  Despite injections, she continues to have pain into the ulnar aspect of the wrist.  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  It should be noted that the patient has a history also of having had a right wrist arthroscopy with Dr. Berenice Primas in 2006.  She is referred to Dr. Grandville Silos for further evaluation and treatment.  Past Medical  History:  Diagnosis Date  . Colon polyps   . Endometrial polyp 03/24/04  . H/O amenorrhea 2008  . H/O constipation 2012  . H/O fatigue 2005  . H/O varicella   . History of ovarian cyst   . Hx of adenomatous polyp of colon 04/23/2009  . Hypothyroidism   . Irregular periods/menstrual cycles 03/24/04  . Irregular periods/menstrual cycles 2005  . Leg length inequality   . PCOS (polycystic ovarian syndrome)   . PONV (postoperative nausea and vomiting)   . Yeast infection     Past Surgical History:  Procedure Laterality Date  . COLONOSCOPY W/ POLYPECTOMY    . HYSTEROSCOPY W/D&C  03/24/2004  . uterine polyps    . WISDOM TOOTH EXTRACTION  12/2010  . wrist cartilage repair  2006    Family History  Problem Relation Age of Onset  . Stroke Father   . Hypertension Father   . Kidney disease Father   . Coronary artery disease Mother        CABG x4  . Colon polyps Mother        precancerous  . Heart disease Mother   . Stroke Paternal Grandmother    Social History:  reports that she has never smoked. She has never used smokeless tobacco. She reports current alcohol use. She reports that she does not use drugs.  Allergies:  Allergies  Allergen Reactions  . Ciprofloxacin Hives and Itching    No medications  prior to admission.    No results found for this or any previous visit (from the past 48 hour(s)). No results found.  Review of Systems  All other systems reviewed and are negative.   Height 5\' 9"  (1.753 m), weight 83.9 kg, last menstrual period 03/31/2018. Physical Exam  Constitutional:  WD, WN, NAD HEENT:  NCAT, EOMI Neuro/Psych:  Alert & oriented to person, place, and time; appropriate mood & affect Lymphatic: No generalized UE edema or lymphadenopathy Extremities / MSK:  Both UE are normal with respect to appearance, ranges of motion, joint stability, muscle strength/tone, sensation, & perfusion except as otherwise noted:  The right wrist is normal in appearance.   There is full digital motion as well as full wrist range of motion.  Strength is equal to contralateral side in wrist flexion and extension.  There is full supination and pronation.  Today, still some tenderness appears to be along the ECU tendon, but the tendon remains in its groove without subluxation.  Negative ulnar carpal abutment testing.  Rotation of the wrist as well as DRUJ glide elicits some palpable and audible pops within the joint mostly on the ulnar aspect.  NVI.  Labs / Xrays:  No radiographic studies obtained today.  X-rays from 03/25/2018 demonstrated no fractures, dislocations or bony lesions.  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  Please see the report for further information.  Assessment: Right wrist pain of uncertain etiology.   Plan:  The findings are discussed with the patient.  Both the radiocarpal joint injection under fluoroscopy and the ECU tendon sheath injection were not significantly helpful in alleviating the intermittent discomfort that this patient continues to describe.  At this point, it is felt that a right wrist arthroscopically to evaluate intra-articular structures and potentially either debride or repair the TFCC is warranted.  The ECU tendon sheath appears to be intact without instability of the tendon in its groove and does not at this time need additional surgical intervention.  The patient wishes to move forward with right wrist arthroscopic surgery. The details of the operative procedure were discussed with the patient.  Questions were invited and answered.  In addition to the goal of the procedure, the risks of the procedure to include but not limited to bleeding; infection; damage to the nerves or blood vessels that could result in bleeding, numbness, weakness, chronic pain, and the need for additional procedures; stiffness; the need for revision surgery; and anesthetic  risks were reviewed.  No specific outcome was guaranteed or implied.  A surgical scheduling sheet has been created and once this is approved, our scheduler will contact her for specifics in regards to her surgery.    Jolyn Nap, MD 01/02/2019, 2:57 PM

## 2019-01-03 LAB — NOVEL CORONAVIRUS, NAA (HOSP ORDER, SEND-OUT TO REF LAB; TAT 18-24 HRS): SARS-CoV-2, NAA: NOT DETECTED

## 2019-01-06 ENCOUNTER — Encounter (HOSPITAL_BASED_OUTPATIENT_CLINIC_OR_DEPARTMENT_OTHER): Payer: Self-pay

## 2019-01-06 ENCOUNTER — Other Ambulatory Visit: Payer: Self-pay

## 2019-01-06 ENCOUNTER — Encounter (HOSPITAL_BASED_OUTPATIENT_CLINIC_OR_DEPARTMENT_OTHER): Payer: Self-pay | Admitting: *Deleted

## 2019-01-06 ENCOUNTER — Ambulatory Visit (HOSPITAL_BASED_OUTPATIENT_CLINIC_OR_DEPARTMENT_OTHER)
Admission: RE | Admit: 2019-01-06 | Discharge: 2019-01-06 | Disposition: A | Discharge status: Home / Self Care | Payer: Worker's Compensation | Attending: Orthopedic Surgery | Admitting: Orthopedic Surgery

## 2019-01-06 ENCOUNTER — Ambulatory Visit (HOSPITAL_BASED_OUTPATIENT_CLINIC_OR_DEPARTMENT_OTHER): Payer: Worker's Compensation | Admitting: Certified Registered"

## 2019-01-06 ENCOUNTER — Encounter (HOSPITAL_BASED_OUTPATIENT_CLINIC_OR_DEPARTMENT_OTHER)
Admission: RE | Disposition: A | Discharge status: Home / Self Care | Payer: Self-pay | Source: Home / Self Care | Attending: Orthopedic Surgery

## 2019-01-06 DIAGNOSIS — X58XXXA Exposure to other specified factors, initial encounter: Secondary | ICD-10-CM | POA: Diagnosis not present

## 2019-01-06 DIAGNOSIS — Z881 Allergy status to other antibiotic agents status: Secondary | ICD-10-CM | POA: Insufficient documentation

## 2019-01-06 DIAGNOSIS — S63511A Sprain of carpal joint of right wrist, initial encounter: Secondary | ICD-10-CM | POA: Diagnosis not present

## 2019-01-06 DIAGNOSIS — S63521A Sprain of radiocarpal joint of right wrist, initial encounter: Secondary | ICD-10-CM | POA: Insufficient documentation

## 2019-01-06 DIAGNOSIS — E039 Hypothyroidism, unspecified: Secondary | ICD-10-CM | POA: Insufficient documentation

## 2019-01-06 DIAGNOSIS — M25531 Pain in right wrist: Secondary | ICD-10-CM | POA: Diagnosis present

## 2019-01-06 HISTORY — DX: Hypothyroidism, unspecified: E03.9

## 2019-01-06 HISTORY — PX: WRIST ARTHROSCOPY WITH DEBRIDEMENT: SHX6194

## 2019-01-06 HISTORY — DX: Other specified postprocedural states: R11.2

## 2019-01-06 HISTORY — DX: Other specified postprocedural states: Z98.890

## 2019-01-06 SURGERY — WRIST ARTHROSCOPY WITH DEBRIDEMENT
Anesthesia: General | Site: Wrist | Laterality: Right

## 2019-01-06 SURGERY — WRIST ARTHROSCOPY WITH DEBRIDEMENT
Anesthesia: General | Laterality: Right

## 2019-01-06 MED ORDER — BUPIVACAINE-EPINEPHRINE (PF) 0.25% -1:200000 IJ SOLN
INTRAMUSCULAR | Status: AC
  Filled 2019-01-06: qty 30

## 2019-01-06 MED ORDER — FENTANYL CITRATE (PF) 100 MCG/2ML IJ SOLN
50.0000 ug | INTRAMUSCULAR | Status: DC | PRN
  Administered 2019-01-06: 100 ug via INTRAVENOUS

## 2019-01-06 MED ORDER — DEXAMETHASONE SODIUM PHOSPHATE 10 MG/ML IJ SOLN
INTRAMUSCULAR | Status: AC
  Filled 2019-01-06: qty 1

## 2019-01-06 MED ORDER — FENTANYL CITRATE (PF) 100 MCG/2ML IJ SOLN
INTRAMUSCULAR | Status: AC
  Filled 2019-01-06: qty 2

## 2019-01-06 MED ORDER — OXYCODONE HCL 5 MG PO TABS: 5.0000 mg | ORAL_TABLET | Freq: Four times a day (QID) | ORAL | 0 refills | Status: DC | PRN

## 2019-01-06 MED ORDER — LIDOCAINE HCL (CARDIAC) PF 100 MG/5ML IV SOSY
PREFILLED_SYRINGE | INTRAVENOUS | Status: DC | PRN
  Administered 2019-01-06: 80 mg via INTRAVENOUS

## 2019-01-06 MED ORDER — BUPIVACAINE-EPINEPHRINE (PF) 0.5% -1:200000 IJ SOLN
INTRAMUSCULAR | Status: AC
  Filled 2019-01-06: qty 30

## 2019-01-06 MED ORDER — CEFAZOLIN SODIUM-DEXTROSE 2-3 GM-%(50ML) IV SOLR
INTRAVENOUS | Status: DC | PRN
  Administered 2019-01-06: 2 g via INTRAVENOUS

## 2019-01-06 MED ORDER — CEFAZOLIN SODIUM-DEXTROSE 2-4 GM/100ML-% IV SOLN
INTRAVENOUS | Status: AC
  Filled 2019-01-06: qty 100

## 2019-01-06 MED ORDER — MIDAZOLAM HCL 2 MG/2ML IJ SOLN
INTRAMUSCULAR | Status: AC
  Filled 2019-01-06: qty 2

## 2019-01-06 MED ORDER — ONDANSETRON HCL 4 MG/2ML IJ SOLN
INTRAMUSCULAR | Status: AC
  Filled 2019-01-06: qty 2

## 2019-01-06 MED ORDER — PROPOFOL 10 MG/ML IV BOLUS
INTRAVENOUS | Status: DC | PRN
  Administered 2019-01-06: 200 mg via INTRAVENOUS

## 2019-01-06 MED ORDER — GLYCOPYRROLATE PF 0.2 MG/ML IJ SOSY
PREFILLED_SYRINGE | INTRAMUSCULAR | Status: AC
  Filled 2019-01-06: qty 1

## 2019-01-06 MED ORDER — CEFAZOLIN SODIUM-DEXTROSE 2-4 GM/100ML-% IV SOLN: 2.0000 g | INTRAVENOUS | Status: DC

## 2019-01-06 MED ORDER — IBUPROFEN 200 MG PO TABS: 600.0000 mg | ORAL_TABLET | Freq: Four times a day (QID) | ORAL | 0 refills | Status: DC

## 2019-01-06 MED ORDER — FENTANYL CITRATE (PF) 100 MCG/2ML IJ SOLN
INTRAMUSCULAR | Status: DC | PRN
  Administered 2019-01-06: 25 ug via INTRATHECAL
  Administered 2019-01-06: 25 ug via INTRAVENOUS

## 2019-01-06 MED ORDER — HYDROMORPHONE HCL 1 MG/ML IJ SOLN: 0.2500 mg | INTRAMUSCULAR | Status: DC | PRN

## 2019-01-06 MED ORDER — MEPERIDINE HCL 25 MG/ML IJ SOLN: 6.2500 mg | INTRAMUSCULAR | Status: DC | PRN

## 2019-01-06 MED ORDER — PROPOFOL 10 MG/ML IV BOLUS
INTRAVENOUS | Status: AC
  Filled 2019-01-06: qty 40

## 2019-01-06 MED ORDER — ROPIVACAINE HCL 5 MG/ML IJ SOLN
INTRAMUSCULAR | Status: DC | PRN
  Administered 2019-01-06: 30 mL via PERINEURAL

## 2019-01-06 MED ORDER — PROMETHAZINE HCL 25 MG/ML IJ SOLN
6.2500 mg | INTRAMUSCULAR | Status: DC | PRN
  Administered 2019-01-06: 6.25 mg via INTRAVENOUS

## 2019-01-06 MED ORDER — SCOPOLAMINE 1 MG/3DAYS TD PT72: 1.0000 | MEDICATED_PATCH | Freq: Once | TRANSDERMAL | Status: DC | PRN

## 2019-01-06 MED ORDER — MIDAZOLAM HCL 2 MG/2ML IJ SOLN
1.0000 mg | INTRAMUSCULAR | Status: DC | PRN
  Administered 2019-01-06: 07:00:00 2 mg via INTRAVENOUS

## 2019-01-06 MED ORDER — LACTATED RINGERS IV SOLN: INTRAVENOUS | Status: DC

## 2019-01-06 MED ORDER — OXYCODONE HCL 5 MG PO TABS: 5.0000 mg | ORAL_TABLET | Freq: Once | ORAL | Status: DC | PRN

## 2019-01-06 MED ORDER — ACETAMINOPHEN 325 MG PO TABS: 650.0000 mg | ORAL_TABLET | Freq: Four times a day (QID) | ORAL | Status: DC

## 2019-01-06 MED ORDER — LACTATED RINGERS IV SOLN
INTRAVENOUS | Status: DC
  Administered 2019-01-06: 07:00:00 via INTRAVENOUS

## 2019-01-06 MED ORDER — ONDANSETRON HCL 4 MG/2ML IJ SOLN
INTRAMUSCULAR | Status: DC | PRN
  Administered 2019-01-06: 4 mg via INTRAVENOUS

## 2019-01-06 MED ORDER — PROMETHAZINE HCL 25 MG/ML IJ SOLN
INTRAMUSCULAR | Status: AC
  Filled 2019-01-06: qty 1

## 2019-01-06 MED ORDER — LIDOCAINE 2% (20 MG/ML) 5 ML SYRINGE
INTRAMUSCULAR | Status: AC
  Filled 2019-01-06: qty 5

## 2019-01-06 MED ORDER — OXYCODONE HCL 5 MG/5ML PO SOLN: 5.0000 mg | Freq: Once | ORAL | Status: DC | PRN

## 2019-01-06 MED ORDER — DEXAMETHASONE SODIUM PHOSPHATE 10 MG/ML IJ SOLN
INTRAMUSCULAR | Status: DC | PRN
  Administered 2019-01-06: 5 mg via INTRAVENOUS

## 2019-01-06 SURGICAL SUPPLY — 79 items
BENZOIN TINCTURE PRP APPL 2/3 (GAUZE/BANDAGES/DRESSINGS) IMPLANT
BLADE MINI RND TIP GREEN BEAV (BLADE) IMPLANT
BLADE SURG 15 STRL LF DISP TIS (BLADE) ×1 IMPLANT
BLADE SURG 15 STRL SS (BLADE) ×2
BNDG COHESIVE 4X5 TAN STRL (GAUZE/BANDAGES/DRESSINGS) ×3 IMPLANT
BNDG ESMARK 4X9 LF (GAUZE/BANDAGES/DRESSINGS) ×3 IMPLANT
BNDG GAUZE ELAST 4 BULKY (GAUZE/BANDAGES/DRESSINGS) ×3 IMPLANT
BUR CUDA 2.9 (BURR) IMPLANT
BUR CUDA 2.9MM (BURR)
BUR FULL RADIUS 2.0 (BURR) IMPLANT
BUR FULL RADIUS 2.0MM (BURR)
BUR FULL RADIUS 2.9 (BURR) IMPLANT
BUR FULL RADIUS 2.9MM (BURR)
BUR GATOR 2.9 (BURR) IMPLANT
BUR GATOR 2.9MM (BURR)
CANISTER SUCT 1200ML W/VALVE (MISCELLANEOUS) ×3 IMPLANT
CHLORAPREP W/TINT 26 (MISCELLANEOUS) ×3 IMPLANT
CLOSURE WOUND 1/2 X4 (GAUZE/BANDAGES/DRESSINGS)
CORD BIPOLAR FORCEPS 12FT (ELECTRODE) IMPLANT
COVER BACK TABLE REUSABLE LG (DRAPES) ×3 IMPLANT
COVER MAYO STAND REUSABLE (DRAPES) ×3 IMPLANT
COVER WAND RF STERILE (DRAPES) IMPLANT
CUFF TOURN SGL QUICK 18X4 (TOURNIQUET CUFF) ×3 IMPLANT
DECANTER SPIKE VIAL GLASS SM (MISCELLANEOUS) IMPLANT
DERMABOND ADVANCED (GAUZE/BANDAGES/DRESSINGS)
DERMABOND ADVANCED .7 DNX12 (GAUZE/BANDAGES/DRESSINGS) IMPLANT
DRAPE EXTREMITY T 121X128X90 (DISPOSABLE) ×3 IMPLANT
DRAPE OEC MINIVIEW 54X84 (DRAPES) IMPLANT
DRAPE SURG 17X23 STRL (DRAPES) ×3 IMPLANT
DRSG EMULSION OIL 3X3 NADH (GAUZE/BANDAGES/DRESSINGS) ×3 IMPLANT
DRSG TEGADERM 4X4.75 (GAUZE/BANDAGES/DRESSINGS) IMPLANT
GAUZE SPONGE 4X4 12PLY STRL LF (GAUZE/BANDAGES/DRESSINGS) ×3 IMPLANT
GLOVE BIO SURGEON STRL SZ 6.5 (GLOVE) ×2 IMPLANT
GLOVE BIO SURGEON STRL SZ7.5 (GLOVE) ×3 IMPLANT
GLOVE BIO SURGEONS STRL SZ 6.5 (GLOVE) ×1
GLOVE BIOGEL PI IND STRL 7.0 (GLOVE) ×3 IMPLANT
GLOVE BIOGEL PI IND STRL 8 (GLOVE) ×1 IMPLANT
GLOVE BIOGEL PI INDICATOR 7.0 (GLOVE) ×6
GLOVE BIOGEL PI INDICATOR 8 (GLOVE) ×2
GLOVE ECLIPSE 6.5 STRL STRAW (GLOVE) ×3 IMPLANT
GOWN STRL REUS W/ TWL LRG LVL3 (GOWN DISPOSABLE) ×3 IMPLANT
GOWN STRL REUS W/TWL LRG LVL3 (GOWN DISPOSABLE) ×6
GOWN STRL REUS W/TWL XL LVL3 (GOWN DISPOSABLE) ×3 IMPLANT
MANIFOLD NEPTUNE II (INSTRUMENTS) IMPLANT
NEEDLE HYPO 22GX1.5 SAFETY (NEEDLE) ×3 IMPLANT
PACK BASIN DAY SURGERY FS (CUSTOM PROCEDURE TRAY) ×3 IMPLANT
PADDING CAST ABS 4INX4YD NS (CAST SUPPLIES) ×2
PADDING CAST ABS COTTON 4X4 ST (CAST SUPPLIES) ×1 IMPLANT
PROBE BIPOLAR ARTHRO 85MM 30D (MISCELLANEOUS) IMPLANT
RETRIEVER SUT HEWSON (MISCELLANEOUS) IMPLANT
SET SM JOINT TUBING/CANN (CANNULA) ×3 IMPLANT
SHAVER SABRE 2.0 (BURR) ×3 IMPLANT
SHEET MEDIUM DRAPE 40X70 STRL (DRAPES) IMPLANT
SLING ARM FOAM STRAP LRG (SOFTGOODS) ×3 IMPLANT
SLING ARM MED ADULT FOAM STRAP (SOFTGOODS) IMPLANT
SLING ARM SM FOAM STRAP (SOFTGOODS) IMPLANT
SLING ARM XL FOAM STRAP (SOFTGOODS) IMPLANT
SPLINT FIBERGLASS 3X35 (CAST SUPPLIES) ×3 IMPLANT
STOCKINETTE 6  STRL (DRAPES) ×2
STOCKINETTE 6 STRL (DRAPES) ×1 IMPLANT
STRIP CLOSURE SKIN 1/2X4 (GAUZE/BANDAGES/DRESSINGS) IMPLANT
SUT ETHIBOND 2 OS 4 DA (SUTURE) IMPLANT
SUT FIBERSTICK 2-0 (SUTURE) ×3 IMPLANT
SUT LASSO 70 DEG MICRO SHORT (SUTURE) ×3
SUT LASSO MICRO STR (SUTURE) ×3 IMPLANT
SUT PDS AB 0 CT 36 (SUTURE) ×3 IMPLANT
SUT PDS AB 2-0 CT2 27 (SUTURE) ×3 IMPLANT
SUT VIC AB 3-0 FS2 27 (SUTURE) ×3 IMPLANT
SUT VICRYL RAPIDE 4-0 (SUTURE) IMPLANT
SUT VICRYL RAPIDE 4/0 PS 2 (SUTURE) IMPLANT
SUTURE LASSO 70 DEG MICR SHORT (SUTURE) ×1 IMPLANT
SYR 10ML LL (SYRINGE) IMPLANT
SYR BULB 3OZ (MISCELLANEOUS) IMPLANT
SYR CONTROL 10ML LL (SYRINGE) ×3 IMPLANT
TOWEL GREEN STERILE FF (TOWEL DISPOSABLE) ×3 IMPLANT
TRAP DIGIT (INSTRUMENTS) ×3 IMPLANT
TRAP FINGER LRG (INSTRUMENTS) IMPLANT
TUBE CONNECTING 20'X1/4 (TUBING) ×1
TUBE CONNECTING 20X1/4 (TUBING) ×2 IMPLANT

## 2019-01-06 NOTE — Interval H&P Note (Signed)
History and Physical Interval Note:  01/06/2019 7:30 AM  Suzanne Taylor  has presented today for surgery, with the diagnosis of Right wrist pain.  The various methods of treatment have been discussed with the patient and family. After consideration of risks, benefits and other options for treatment, the patient has consented to  Procedure(s): RIGHT WRIST ARTHROSCOPY WITH TRIANGULAR FIBROCARTILAGE COMPLEX DEBRIDEMENT VS REPAIR (Right) as a surgical intervention.  The patient's history has been reviewed, patient examined, no change in status, stable for surgery.  I have reviewed the patient's chart and labs.  Questions were answered to the patient's satisfaction.     Jolyn Nap

## 2019-01-06 NOTE — Discharge Instructions (Signed)
Discharge Instructions   You have a dressing with a plaster splint incorporated in it. Move your fingers as much as possible, making a full fist and fully opening the fist. Elevate your hand to reduce pain & swelling of the digits.  Ice over the operative site may be helpful to reduce pain & swelling.  DO NOT USE HEAT. Pain medicine has been prescribed for you.  Take 600 mg ibuprofen and 650 mg of Tylenol OTC every 6 hours together. Take the Oxycodone 5 mg additionally for severe post operative pain as a rescue medicine. Leave the dressing in place until you return to our office.  You may shower, but keep the bandage clean & dry.  You may drive a car when you are off of prescription pain medications and can safely control your vehicle with both hands. Our office will call you to arrange follow-up   Please call [phone] during normal business hours or [phone] after hours for any problems. Including the following:  - excessive redness of the incisions - drainage for more than 4 days - fever of more than 101.5 F  *Please note that pain medications will not be refilled after hours or on weekends.  WORK STATUS: No work with the right hand until post operative follow appointment.     Post Anesthesia Home Care Instructions  Activity: Get plenty of rest for the remainder of the day. A responsible individual must stay with you for 24 hours following the procedure.  For the next 24 hours, DO NOT: -Drive a car -Paediatric nurse -Drink alcoholic beverages -Take any medication unless instructed by your physician -Make any legal decisions or sign important papers.  Meals: Start with liquid foods such as gelatin or soup. Progress to regular foods as tolerated. Avoid greasy, spicy, heavy foods. If nausea and/or vomiting occur, drink only clear liquids until the nausea and/or vomiting subsides. Call your physician if vomiting continues.  Special Instructions/Symptoms: Your throat  may feel dry or sore from the anesthesia or the breathing tube placed in your throat during surgery. If this causes discomfort, gargle with warm salt water. The discomfort should disappear within 24 hours.  If you had a scopolamine patch placed behind your ear for the management of post- operative nausea and/or vomiting:  1. The medication in the patch is effective for 72 hours, after which it should be removed.  Wrap patch in a tissue and discard in the trash. Wash hands thoroughly with soap and water. 2. You may remove the patch earlier than 72 hours if you experience unpleasant side effects which may include dry mouth, dizziness or visual disturbances. 3. Avoid touching the patch. Wash your hands with soap and water after contact with the patch.      Regional Anesthesia Blocks  1. Numbness or the inability to move the "blocked" extremity may last from 3-48 hours after placement. The length of time depends on the medication injected and your individual response to the medication. If the numbness is not going away after 48 hours, call your surgeon.  2. The extremity that is blocked will need to be protected until the numbness is gone and the  Strength has returned. Because you cannot feel it, you will need to take extra care to avoid injury. Because it may be weak, you may have difficulty moving it or using it. You may not know what position it is in without looking at it while the block is in effect.  3. For blocks in the legs  and feet, returning to weight bearing and walking needs to be done carefully. You will need to wait until the numbness is entirely gone and the strength has returned. You should be able to move your leg and foot normally before you try and bear weight or walk. You will need someone to be with you when you first try to ensure you do not fall and possibly risk injury.  4. Bruising and tenderness at the needle site are common side effects and will resolve in a few days.  5.  Persistent numbness or new problems with movement should be communicated to the surgeon or the [facility] [phone] [place] [phone]).

## 2019-01-06 NOTE — Op Note (Signed)
01/06/2019  7:31 AM  PATIENT:  Suzanne Taylor  48 y.o. 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 Grandville Silos, MD  PHYSICIAN ASSISTANT: Morley Kos, OPA-C  ANESTHESIA:  preop regional / general  SPECIMENS:  None  DRAINS:   None  EBL:  less than 50 mL  PREOPERATIVE INDICATIONS:  [name] is a  48 y.o. female with persisting right ulnar sided wrist pain following trauma.  MRI suggestive of TFCC pathology.  The risks benefits and alternatives were discussed with the patient preoperatively including but not limited to the risks of infection, bleeding, nerve injury, cardiopulmonary complications, the need for revision surgery, among others, and the patient verbalized understanding and consented to proceed.  OPERATIVE IMPLANTS: none  OPERATIVE PROCEDURE:  After receiving prophylactic antibiotics and a regional block, the patient was escorted to the operative theatre and placed in a supine position.  General anesthesia was administered.  A surgical "time-out" was performed during which the planned procedure, proposed operative site, and the correct patient identity were compared to the operative consent and agreement confirmed by the circulating nurse according to current facility policy.  Following application of a tourniquet to the operative extremity, the exposed skin was prepped with Chloraprep and draped in the usual sterile fashion.  The limb was positioned in a Linvatec traction tower, and external landmarks were drawn.  12 pounds of traction was initiated and the joint insufflated with 22-gauge needle through the suspected 3-4 portal location.  The limb was exsanguinated with an Esmarch bandage and the tourniquet inflated to approximately 181mmHg higher than systolic BP.  The  3-4 portal was established in standard fashion, incising just to the skin and then spreading down to the capsule, perforating it with the blunt end of the hemostat.  The scope was inserted with the blunt obturator.  Arthroscopy commenced.  The scaphoid cartilage at the cartilage of the distal radius was fairly unremarkable, except for some small ulnar-sided scuffing on the articular surface of the distal radius.  There was partial tearing of the central portion of the SLIL.  The TFCC was found to have a central perforation, as well as an incomplete dorsal peripheral tear.  The 6R portal was established and same fashion, and the 2.0 mm full-radius suction shaver inserted.  The SL IL was debrided,, and attention was turned to the TFCC.  Debriding with the suction shaver was insufficient, and basket forceps were inserted, and used to help resect the central TFCC perforation to be slightly larger back to stable rims, and this was again debrided with the suction shaver.  The 6R portal was lengthened both proximally and distally, the fifth compartment opened and the tendon transposed.  Dorsal peripheral TFCC repair was performed using Arthrex instrumentation, placing 2 simple sutures to the dorsal margin of the TFCC and repairing it to the dorsal capsule.  This was done with 2-0 and 0 PDS sutures.  There was increased translational stability conferred and more of a trampoline effect now restored with the remaining portions of the TFCC.  The sutures were clipped, folded over and the fifth compartment repaired over the sutures, allowing the EDQ to be above the retinaculum at this point.  Tourniquet was released, and the skin was closed with 4-0 Vicryl Rapide interrupted and simple sutures.  Traction was discontinued and sugar tong splint applied with the forearm in neutral rotation.  She was awakened and taken recovery in stable condition, breathing spontaneously.  DISPOSITION: She will be discharged home with typical  instructions, return in 10 to 15 days, without x-rays, transitioning to therapy for [place] splint fabrication, not yet initiating forearm rotation.

## 2019-01-06 NOTE — Anesthesia Postprocedure Evaluation (Signed)
Anesthesia Post Note  Patient: Suzanne Taylor  Procedure(s) Performed: RIGHT WRIST ARTHROSCOPY WITH TRIANGULAR FIBROCARTILAGE COMPLEX DEBRIDEMENT REPAIR (Right Wrist)     Patient location during evaluation: PACU Anesthesia Type: General Level of consciousness: awake and alert Pain management: pain level controlled Vital Signs Assessment: post-procedure vital signs reviewed and stable Respiratory status: spontaneous breathing, nonlabored ventilation and respiratory function stable Cardiovascular status: blood pressure returned to baseline and stable Postop Assessment: no apparent nausea or vomiting Anesthetic complications: no    Last Vitals:  Vitals:   01/06/19 1000 01/06/19 1015  BP: 109/65 116/69  Pulse: 77 74  Resp: 18 16  Temp:  36.5 C  SpO2: 96% 96%    Last Pain:  Vitals:   01/06/19 1015  TempSrc:   PainSc: 0-No pain                 Lynda Rainwater

## 2019-01-06 NOTE — Anesthesia Preprocedure Evaluation (Signed)
Anesthesia Evaluation  Patient identified by MRN, date of birth, ID band Patient awake    Reviewed: Allergy & Precautions, NPO status , Patient's Chart, lab work & pertinent test results  History of Anesthesia Complications (+) PONV  Airway Mallampati: II  TM Distance: >3 FB Neck ROM: Full    Dental no notable dental hx.    Pulmonary neg pulmonary ROS,    Pulmonary exam normal breath sounds clear to auscultation       Cardiovascular negative cardio ROS Normal cardiovascular exam Rhythm:Regular Rate:Normal     Neuro/Psych negative neurological ROS  negative psych ROS   GI/Hepatic negative GI ROS, Neg liver ROS,   Endo/Other  Hypothyroidism   Renal/GU negative Renal ROS  negative genitourinary   Musculoskeletal negative musculoskeletal ROS (+)   Abdominal   Peds negative pediatric ROS (+)  Hematology negative hematology ROS (+)   Anesthesia Other Findings   Reproductive/Obstetrics negative OB ROS                             Anesthesia Physical Anesthesia Plan  ASA: II  Anesthesia Plan: General   Post-op Pain Management:  Regional for Post-op pain   Induction: Intravenous  PONV Risk Score and Plan: 4 or greater and Ondansetron, Dexamethasone, Midazolam, Scopolamine patch - Pre-op and Treatment may vary due to age or medical condition  Airway Management Planned: LMA  Additional Equipment:   Intra-op Plan:   Post-operative Plan: Extubation in OR  Informed Consent: I have reviewed the patients History and Physical, chart, labs and discussed the procedure including the risks, benefits and alternatives for the proposed anesthesia with the patient or authorized representative who has indicated his/her understanding and acceptance.     Dental advisory given  Plan Discussed with: CRNA  Anesthesia Plan Comments:         Anesthesia Quick Evaluation

## 2019-01-06 NOTE — Progress Notes (Signed)
Assisted Dr. Miller with right, ultrasound guided, supraclavicular block. Side rails up, monitors on throughout procedure. See vital signs in flow sheet. Tolerated Procedure well. 

## 2019-01-06 NOTE — Transfer of Care (Signed)
Immediate Anesthesia Transfer of Care Note  Patient: Suzanne Taylor  Procedure(s) Performed: RIGHT WRIST ARTHROSCOPY WITH TRIANGULAR FIBROCARTILAGE COMPLEX DEBRIDEMENT VS REPAIR (Right Wrist)  Patient Location: PACU  Anesthesia Type:General and regiona  Level of Consciousness: awake, alert  and oriented  Airway & Oxygen Therapy: Patient Spontanous Breathing and Patient connected to nasal cannula oxygen  Post-op Assessment: Report given to RN and Post -op Vital signs reviewed and stable  Post vital signs: Reviewed and stable  Last Vitals:  Vitals Value Taken Time  BP    Temp    Pulse    Resp    SpO2      Last Pain:  Vitals:   01/06/19 0636  TempSrc: Oral  PainSc: 0-No pain         Complications: No apparent anesthesia complications

## 2019-01-06 NOTE — Anesthesia Procedure Notes (Signed)
Procedure Name: LMA Insertion Performed by: Armonii Sieh M, CRNA Pre-anesthesia Checklist: Patient identified, Emergency Drugs available, Suction available, Patient being monitored and Timeout performed Patient Re-evaluated:Patient Re-evaluated prior to induction Oxygen Delivery Method: Circle system utilized Preoxygenation: Pre-oxygenation with 100% oxygen Induction Type: IV induction LMA: LMA inserted LMA Size: 4.0 Tube type: Oral Placement Confirmation: positive ETCO2,  CO2 detector and breath sounds checked- equal and bilateral Tube secured with: Tape Dental Injury: Teeth and Oropharynx as per pre-operative assessment        

## 2019-01-06 NOTE — Anesthesia Procedure Notes (Signed)
Anesthesia Regional Block: Supraclavicular block   Pre-Anesthetic Checklist: ,, timeout performed, Correct Patient, Correct Site, Correct Laterality, Correct Procedure, Correct Position, site marked, Risks and benefits discussed,  Surgical consent,  Pre-op evaluation,  At surgeon's request and post-op pain management  Laterality: Right  Prep: chloraprep       Needles:  Injection technique: Single-shot  Needle Type: Stimiplex     Needle Length: 9cm  Needle Gauge: 21     Additional Needles:   Procedures:,,,, ultrasound used (permanent image in chart),,,,  Narrative:  Start time: 01/06/2019 7:02 AM End time: 01/06/2019 7:07 AM Injection made incrementally with aspirations every 5 mL.  Performed by: Personally  Anesthesiologist: Lynda Rainwater, MD

## 2019-01-07 ENCOUNTER — Encounter (HOSPITAL_BASED_OUTPATIENT_CLINIC_OR_DEPARTMENT_OTHER): Payer: Self-pay | Admitting: Orthopedic Surgery

## 2019-01-22 ENCOUNTER — Other Ambulatory Visit (INDEPENDENT_AMBULATORY_CARE_PROVIDER_SITE_OTHER): Payer: PRIVATE HEALTH INSURANCE

## 2019-01-22 ENCOUNTER — Other Ambulatory Visit: Payer: Self-pay

## 2019-01-22 DIAGNOSIS — R7989 Other specified abnormal findings of blood chemistry: Secondary | ICD-10-CM

## 2019-01-22 NOTE — Progress Notes (Signed)
lab

## 2019-01-23 ENCOUNTER — Other Ambulatory Visit (INDEPENDENT_AMBULATORY_CARE_PROVIDER_SITE_OTHER): Payer: PRIVATE HEALTH INSURANCE

## 2019-01-23 DIAGNOSIS — R7989 Other specified abnormal findings of blood chemistry: Secondary | ICD-10-CM | POA: Diagnosis not present

## 2019-01-23 LAB — T4, FREE: Free T4: 1.34 ng/dL (ref 0.60–1.60)

## 2019-01-23 LAB — TSH: TSH: 0.02 u[IU]/mL — ABNORMAL LOW (ref 0.35–4.50)

## 2019-04-05 ENCOUNTER — Other Ambulatory Visit: Payer: Self-pay | Admitting: Family Medicine

## 2019-05-30 ENCOUNTER — Other Ambulatory Visit: Payer: Self-pay

## 2019-06-02 ENCOUNTER — Encounter: Payer: Self-pay | Admitting: Family Medicine

## 2019-06-02 ENCOUNTER — Ambulatory Visit (INDEPENDENT_AMBULATORY_CARE_PROVIDER_SITE_OTHER): Payer: PRIVATE HEALTH INSURANCE | Admitting: Family Medicine

## 2019-06-02 ENCOUNTER — Other Ambulatory Visit: Payer: Self-pay

## 2019-06-02 VITALS — BP 130/82 | HR 93 | Temp 97.4°F | Ht 69.0 in | Wt 192.4 lb

## 2019-06-02 DIAGNOSIS — Z23 Encounter for immunization: Secondary | ICD-10-CM

## 2019-06-02 DIAGNOSIS — Z0000 Encounter for general adult medical examination without abnormal findings: Secondary | ICD-10-CM | POA: Diagnosis not present

## 2019-06-02 NOTE — Progress Notes (Signed)
Chief Complaint  Patient presents with  . Annual Exam     Well Woman Suzanne Taylor is here for a complete physical.   Her last physical was >1 year ago.  Current diet: in general, a "healthy" diet. Current exercise: elliptical, . Weight is stable and she denies daytime fatigue. No LMP recorded. Patient is perimenopausal. Seatbelt? Yes  Health Maintenance Pap/HPV- Yes Mammogram- Yes Colon cancer screening-Yes Shingrix- No Tetanus- No HIV screening- Yes  Past Medical History:  Diagnosis Date  . Colon polyps   . Endometrial polyp 03/24/04  . H/O amenorrhea 2008  . H/O constipation 2012  . H/O fatigue 2005  . H/O varicella   . History of ovarian cyst   . Hx of adenomatous polyp of colon 04/23/2009  . Hypothyroidism   . Irregular periods/menstrual cycles 03/24/04  . Irregular periods/menstrual cycles 2005  . Leg length inequality   . PCOS (polycystic ovarian syndrome)   . PONV (postoperative nausea and vomiting)   . Yeast infection      Past Surgical History:  Procedure Laterality Date  . COLONOSCOPY W/ POLYPECTOMY    . HYSTEROSCOPY W/D&C  03/24/2004  . uterine polyps    . WISDOM TOOTH EXTRACTION  12/2010  . WRIST ARTHROSCOPY WITH DEBRIDEMENT Right 01/06/2019   Procedure: RIGHT WRIST ARTHROSCOPY WITH TRIANGULAR FIBROCARTILAGE COMPLEX DEBRIDEMENT REPAIR;  Surgeon: Milly Jakob, MD;  Location: [facility];  Service: Orthopedics;  Laterality: Right;  . wrist cartilage repair  2006    Medications  Current Outpatient Medications on File Prior to Visit  Medication Sig Dispense Refill  . levothyroxine (SYNTHROID) 137 MCG tablet TAKE 1 TABLET (137 MCG TOTAL) BY MOUTH DAILY BEFORE BREAKFAST. 90 tablet 1  . Norethindrone-Ethinyl Estradiol-Fe Biphas (LO LOESTRIN FE) 1 MG-10 MCG / 10 MCG tablet Take 1 tablet by mouth daily.     Allergies Allergies  Allergen Reactions  . Ciprofloxacin Hives and Itching    Review of Systems: Constitutional:  no unexpected weight  changes Eye:  no recent significant change in vision Ear/Nose/Mouth/Throat:  Ears:  no recent change in hearing Nose/Mouth/Throat:  no complaints of nasal congestion, no sore throat Cardiovascular: no chest pain Respiratory:  no shortness of breath Gastrointestinal:  no abdominal pain, no change in bowel habits GU:  Female: negative for dysuria or pelvic pain Musculoskeletal/Extremities:  no pain of the joints Integumentary (Skin/Breast):  no abnormal skin lesions reported Neurologic:  no headaches Endocrine:  denies fatigue Hematologic/Lymphatic:  No areas of easy bleeding  Exam BP 130/82 (BP Location: Left Arm, Patient Position: Sitting, Cuff Size: Normal)   Pulse 93   Temp (!) 97.4 F (36.3 C) (Temporal)   Ht 5\' 9"  (1.753 m)   Wt 192 lb 6 oz (87.3 kg)   SpO2 98%   BMI 28.41 kg/m  General:  well developed, well nourished, in no apparent distress Skin:  no significant moles, warts, or growths Head:  no masses, lesions, or tenderness Eyes:  pupils equal and round, sclera anicteric without injection Ears:  canals without lesions, TMs shiny without retraction, no obvious effusion, no erythema Nose:  nares patent, septum midline, mucosa normal, and no drainage or sinus tenderness Throat/Pharynx:  lips and gingiva without lesion; tongue and uvula midline; non-inflamed pharynx; no exudates or postnasal drainage Neck: neck supple without adenopathy, thyromegaly, or masses Lungs:  clear to auscultation, breath sounds equal bilaterally, no respiratory distress Cardio:  regular rate and rhythm, no LE edema Abdomen:  abdomen soft, nontender; bowel sounds normal;  no masses or organomegaly Genital: Defer to GYN Musculoskeletal:  symmetrical muscle groups noted without atrophy or deformity Extremities:  no clubbing, cyanosis, or edema, no deformities, no skin discoloration Neuro:  gait normal; deep tendon reflexes normal and symmetric Psych: well oriented with normal range of affect and  appropriate judgment/insight  Assessment and Plan  Well adult exam - Plan: CBC, Comprehensive metabolic panel, Lipid panel, T4, free, TSH  Need for tetanus booster - Plan: Tdap vaccine greater than or equal to 46yo IM   Well 48 y.o. female. Counseled on diet and exercise. Other orders as above. Follow up in 1 yr. The patient voiced understanding and agreement to the plan.  [place], DO 06/02/19 3:58 PM

## 2019-06-02 NOTE — Patient Instructions (Addendum)

## 2019-06-03 ENCOUNTER — Other Ambulatory Visit: Payer: Self-pay | Admitting: Family Medicine

## 2019-06-03 DIAGNOSIS — E7800 Pure hypercholesterolemia, unspecified: Secondary | ICD-10-CM

## 2019-06-03 LAB — CBC
HCT: 45.1 % (ref 36.0–46.0)
Hemoglobin: 15.1 g/dL — ABNORMAL HIGH (ref 12.0–15.0)
MCHC: 33.5 g/dL (ref 30.0–36.0)
MCV: 88.8 fl (ref 78.0–100.0)
Platelets: 266 10*3/uL (ref 150.0–400.0)
RBC: 5.08 Mil/uL (ref 3.87–5.11)
RDW: 13.4 % (ref 11.5–15.5)
WBC: 9 10*3/uL (ref 4.0–10.5)

## 2019-06-03 LAB — T4, FREE: Free T4: 1.28 ng/dL (ref 0.60–1.60)

## 2019-06-03 LAB — COMPREHENSIVE METABOLIC PANEL
ALT: 10 U/L (ref 0–35)
AST: 12 U/L (ref 0–37)
Albumin: 4.2 g/dL (ref 3.5–5.2)
Alkaline Phosphatase: 65 U/L (ref 39–117)
BUN: 11 mg/dL (ref 6–23)
CO2: 27 mEq/L (ref 19–32)
Calcium: 9.7 mg/dL (ref 8.4–10.5)
Chloride: 104 mEq/L (ref 96–112)
Creatinine, Ser: 0.9 mg/dL (ref 0.40–1.20)
GFR: 66.11 mL/min (ref >60.00)
Glucose, Bld: 89 mg/dL (ref 70–99)
Potassium: 4.2 mEq/L (ref 3.5–5.1)
Sodium: 139 mEq/L (ref 135–145)
Total Bilirubin: 0.5 mg/dL (ref 0.2–1.2)
Total Protein: 6.9 g/dL (ref 6.0–8.3)

## 2019-06-03 LAB — LIPID PANEL
Cholesterol: 212 mg/dL — ABNORMAL HIGH (ref 0–200)
HDL: 40.4 mg/dL (ref >39.00)
LDL Cholesterol: 149 mg/dL — ABNORMAL HIGH (ref 0–99)
NonHDL: 171.93
Total CHOL/HDL Ratio: 5
Triglycerides: 114 mg/dL (ref 0.0–149.0)
VLDL: 22.8 mg/dL (ref 0.0–40.0)

## 2019-06-03 LAB — TSH: TSH: 0.04 u[IU]/mL — ABNORMAL LOW (ref 0.35–4.50)

## 2019-09-03 ENCOUNTER — Other Ambulatory Visit: Payer: PRIVATE HEALTH INSURANCE

## 2019-09-26 ENCOUNTER — Telehealth: Payer: Self-pay | Admitting: Family Medicine

## 2019-09-26 NOTE — Telephone Encounter (Signed)
Pt called and noted she had an insurance change and has to change providers

## 2019-09-28 ENCOUNTER — Other Ambulatory Visit: Payer: Self-pay | Admitting: Family Medicine

## 2019-10-01 ENCOUNTER — Other Ambulatory Visit: Payer: PRIVATE HEALTH INSURANCE

## 2020-03-18 ENCOUNTER — Other Ambulatory Visit: Payer: Self-pay | Admitting: Obstetrics and Gynecology

## 2020-03-18 DIAGNOSIS — R928 Other abnormal and inconclusive findings on diagnostic imaging of breast: Secondary | ICD-10-CM

## 2020-04-02 ENCOUNTER — Other Ambulatory Visit: Payer: Self-pay

## 2020-04-02 ENCOUNTER — Ambulatory Visit: Payer: PRIVATE HEALTH INSURANCE

## 2020-04-02 ENCOUNTER — Ambulatory Visit
Admission: RE | Admit: 2020-04-02 | Discharge: 2020-04-02 | Disposition: A | Discharge status: Home / Self Care | Payer: PRIVATE HEALTH INSURANCE | Source: Ambulatory Visit | Attending: Obstetrics and Gynecology | Admitting: Obstetrics and Gynecology

## 2020-04-02 DIAGNOSIS — R928 Other abnormal and inconclusive findings on diagnostic imaging of breast: Secondary | ICD-10-CM

## 2022-01-30 ENCOUNTER — Encounter: Payer: Self-pay | Admitting: Physician Assistant

## 2022-02-27 ENCOUNTER — Encounter: Payer: Self-pay | Admitting: Physician Assistant

## 2022-02-27 ENCOUNTER — Ambulatory Visit: Payer: Commercial Managed Care - PPO | Admitting: Physician Assistant

## 2022-02-27 VITALS — BP 118/72 | HR 92 | Ht 69.0 in | Wt 201.8 lb

## 2022-02-27 DIAGNOSIS — K5901 Slow transit constipation: Secondary | ICD-10-CM

## 2022-02-27 DIAGNOSIS — Z86010 Personal history of colonic polyps: Secondary | ICD-10-CM | POA: Diagnosis not present

## 2022-02-27 NOTE — Patient Instructions (Signed)
If you are age 53 or older, your body mass index should be between 23-30. Your Body mass index is 29.8 kg/m. If this is out of the aforementioned range listed, please consider follow up with your Primary Care Provider. ________________________________________________________  The [facility] providers would like to encourage you to use [facility] to communicate with providers for non-urgent requests or questions.  Due to long hold times on the telephone, sending your provider a message by [facility] may be a faster and more efficient way to get a response.  Please allow 48 business hours for a response.  Please remember that this is for non-urgent requests.  _______________________________________________________  It has been recommended to you by your physician that you have a(n) Colonoscopy completed. Per your request, we did not schedule the procedure(s) today. Please contact our office at [phone] should you decide to have the procedure completed. You will be scheduled for a pre-visit and procedure at that time. Call the office toward the end of September/Beginning of October to schedule a colonoscopy with Dr. Carlean Purl.  Take Dulcolax 1-2 tablet as needed and/or try 1/2 capful of Miralax in 6 ounces of water or juice every other day  Thank you for entrusting me with your care and choosing [facility].  Amy Esterwood, PA-C

## 2022-02-27 NOTE — Progress Notes (Signed)
Subjective:    Patient ID: Suzanne Taylor, female    DOB: December 29, 1970, 51 y.o.   MRN: [id number]  HPI Suzanne Taylor is a pleasant 51 year old white female, established with Dr. Carlean Purl.  She comes in today with complaints of constipation, and concerns about follow-up colonoscopy. She was last seen here in 2016 when she had undergone colonoscopy at that time for follow-up of history of adenomatous polyp.  The exam in 2016 was normal and she was recommended to have 5-year interval follow-up. She has family history of colon cancer in her grandmother, and history of adenomatous polyps in her mother at a young age. Patient says that she has had mild issues with constipation in the past but never to the point of requiring laxatives regularly.  Over the past several months her constipation has been worse and she complains of a bloated sensation after eating.  She will go 3 to 4 days at least between bowel movements and then may not pass a lot of stool.  She is not complaining of any abdominal pain or cramping, no melena or hematochezia, appetite has been fine. She is currently using Dulcolax 2 tablets as needed which she may take once a week or so. She had tried taking MiraLAX on a daily basis but says at times that made her stool too loose.  She is in general good health, history of hypothyroidism, on replacement. She reports that when she had called here previously she was told she did not need a follow-up colonoscopy for 10 years.  Review of Systems Pertinent positive and negative review of systems were noted in the above HPI section.  All other review of systems was otherwise negative.   Outpatient Encounter Medications as of 02/27/2022  Medication Sig   cholecalciferol (VITAMIN D3) 25 MCG (1000 UNIT) tablet Take 15,000 Units by mouth once a week.   levothyroxine (SYNTHROID) 137 MCG tablet TAKE 1 TABLET (137 MCG TOTAL) BY MOUTH DAILY BEFORE BREAKFAST. (Patient taking differently: Take 75 mcg by mouth daily  before breakfast.)   Norethindrone-Ethinyl Estradiol-Fe Biphas (LO LOESTRIN FE) 1 MG-10 MCG / 10 MCG tablet Take 1 tablet by mouth daily.   No facility-administered encounter medications on file as of 02/27/2022.   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   Social Determinants of Health   Financial Resource Strain: Not on file  Food Insecurity: Not on file  Transportation Needs: Not on file  Physical Activity: Not on file  Stress: Not on file  Social Connections: Not on file  Intimate Partner Violence: Not on file    Suzanne Taylor family history includes Colon polyps in her mother; Coronary artery disease in her mother; Heart disease in her mother; Hypertension in her father; Kidney disease in her father;  Stroke in her father and paternal grandmother.      Objective:    Vitals:   02/27/22 1435  BP: 118/72  Pulse: 92  SpO2: 96%    Physical Exam Well-developed well-nourished white female in no acute distress.  Pleasant height, Weight, 201 BMI 29.8  HEENT; nontraumatic normocephalic, EOMI, PE R LA, sclera anicteric. Oropharynx; not examined today Neck; supple, no JVD Cardiovascular; regular rate and rhythm with S1-S2, no murmur rub or gallop Pulmonary; Clear bilaterally Abdomen; soft, nontender, nondistended, no palpable mass or hepatosplenomegaly, bowel sounds are active Rectal; not done today Skin; benign exam, no jaundice rash or appreciable lesions Extremities; no clubbing cyanosis or edema skin warm and dry Neuro/Psych; alert and oriented x4, grossly nonfocal mood and affect appropriate        Assessment & Plan:   #8 51 year old white female with chronic constipation, some recent worsening.  #2 history of adenomatous colon polyp 2010, negative colonoscopy 2016. #3 family history of colon cancer in grandmother, and history of adenomatous colon polyps in patient's mother at young age #4 hypothyroidism  Plan; we discussed drinking 68 to 70 ounces of water daily, she does not find fiber supplementation helpful, and feels that the more fiber she eats the more bloated she will feel.  She does not need to continue to push fiber or take a fiber supplement at this point, just continue to eat a healthy diet with lots of fruits and vegetables. Okay to use Dulcolax 1 to 2 tablets as needed. We also discussed trying MiraLAX one half dose daily, or full dose every other day, she can titrate.  We will schedule for colonoscopy with Dr. Carlean Purl.  Procedure was discussed in detail with the patient including indications risk and benefits and she is agreeable to proceed. At the end of the visit patient decided that she needed to wait until early November to schedule the procedure, to be able  to coordinate a caregiver at that time.  She is asked to call back in a couple of weeks and we can get her scheduled for the procedure once that schedule is released.  Bronco Mcgrory S Lieutenant Abarca PA-C 02/27/2022   Cc: No ref. provider found

## 2022-04-17 ENCOUNTER — Encounter: Payer: Self-pay | Admitting: Internal Medicine

## 2022-05-05 ENCOUNTER — Ambulatory Visit (AMBULATORY_SURGERY_CENTER): Payer: Self-pay

## 2022-05-05 VITALS — Ht 69.0 in | Wt 202.0 lb

## 2022-05-05 DIAGNOSIS — Z86010 Personal history of colonic polyps: Secondary | ICD-10-CM

## 2022-05-05 MED ORDER — NA SULFATE-K SULFATE-MG SULF 17.5-3.13-1.6 GM/177ML PO SOLN: 1.0000 | ORAL | 0 refills | Status: DC

## 2022-05-05 NOTE — Progress Notes (Signed)
No egg or soy allergy known to patient  No issues known to pt with past sedation with any surgeries or procedures Patient denies ever being told they had issues or difficulty with intubation  No FH of Malignant Hyperthermia Pt is not on diet pills Pt is not on  home 02  Pt is not on blood thinners  Pt denies issues with constipation  No A fib or A flutter Have any cardiac testing pending--denied Pt instructed to use [email or web address] or GoodRx for a price reduction on prep   Instructions printed for pt and also sent via MyChart

## 2022-05-26 ENCOUNTER — Encounter: Payer: Self-pay | Admitting: Internal Medicine

## 2022-06-02 ENCOUNTER — Ambulatory Visit (AMBULATORY_SURGERY_CENTER): Payer: Commercial Managed Care - PPO | Admitting: Internal Medicine

## 2022-06-02 ENCOUNTER — Encounter: Payer: Self-pay | Admitting: Internal Medicine

## 2022-06-02 VITALS — BP 104/76 | HR 72 | Temp 98.2°F | Resp 11 | Ht 69.0 in | Wt 202.0 lb

## 2022-06-02 DIAGNOSIS — Z09 Encounter for follow-up examination after completed treatment for conditions other than malignant neoplasm: Secondary | ICD-10-CM | POA: Diagnosis not present

## 2022-06-02 DIAGNOSIS — Z800 Family history of malignant neoplasm of digestive organs: Secondary | ICD-10-CM

## 2022-06-02 DIAGNOSIS — D123 Benign neoplasm of transverse colon: Secondary | ICD-10-CM

## 2022-06-02 DIAGNOSIS — Z86010 Personal history of colonic polyps: Secondary | ICD-10-CM | POA: Diagnosis not present

## 2022-06-02 MED ORDER — SODIUM CHLORIDE 0.9 % IV SOLN: 500.0000 mL | Freq: Once | INTRAVENOUS | Status: DC

## 2022-06-02 NOTE — Patient Instructions (Addendum)
Handouts provided about diverticulosis and polyps.  Resume previous diet. Continue current medications.  Await pathology results.  Repeat colonoscopy in 5 years, 2028.       YOU HAD AN ENDOSCOPIC PROCEDURE TODAY AT [facility]:   Refer to the procedure report that was given to you for any specific questions about what was found during the examination.  If the procedure report does not answer your questions, please call your gastroenterologist to clarify.  If you requested that your care partner not be given the details of your procedure findings, then the procedure report has been included in a sealed envelope for you to review at your convenience later.  YOU SHOULD EXPECT: Some feelings of bloating in the abdomen. Passage of more gas than usual.  Walking can help get rid of the air that was put into your GI tract during the procedure and reduce the bloating. If you had a lower endoscopy (such as a colonoscopy or flexible sigmoidoscopy) you may notice spotting of blood in your stool or on the toilet paper. If you underwent a bowel prep for your procedure, you may not have a normal bowel movement for a few days.  Please Note:  You might notice some irritation and congestion in your nose or some drainage.  This is from the oxygen used during your procedure.  There is no need for concern and it should clear up in a day or so.  SYMPTOMS TO REPORT IMMEDIATELY:  Following lower endoscopy (colonoscopy or flexible sigmoidoscopy):  Excessive amounts of blood in the stool  Significant tenderness or worsening of abdominal pains  Swelling of the abdomen that is new, acute  Fever of 100F or higher  For urgent or emergent issues, a gastroenterologist can be reached at any hour by calling [phone]. Do not use MyChart messaging for urgent concerns.    DIET:  We do recommend a small meal at first, but then you may proceed to your regular diet.  Drink plenty of fluids but you should avoid  alcoholic beverages for 24 hours.  ACTIVITY:  You should plan to take it easy for the rest of today and you should NOT DRIVE or use heavy machinery until tomorrow (because of the sedation medicines used during the test).    FOLLOW UP: Our staff will call the number listed on your records the next business day following your procedure.  We will call around 7:15- 8:00 am to check on you and address any questions or concerns that you may have regarding the information given to you following your procedure. If we do not reach you, we will leave a message.     If any biopsies were taken you will be contacted by phone or by letter within the next 1-3 weeks.  Please call us at [phone] if you have not heard about the biopsies in 3 weeks.    SIGNATURES/CONFIDENTIALITY: You and/or your care partner have signed paperwork which will be entered into your electronic medical record.  These signatures attest to the fact that that the information above on your After Visit Summary has been reviewed and is understood.  Full responsibility of the confidentiality of this discharge information lies with you and/or your care-partner.I found and removed one tiny polyp that looks benign.  You also have a condition called diverticulosis - common and not usually a problem. Please read the handout provided.  I will let you know pathology results.  Your next routine colonoscopy should be in  5 years - 2028.  I appreciate the opportunity to care for you. Gatha Mayer, MD, [facility]

## 2022-06-02 NOTE — Op Note (Signed)
[facility] Patient Name: Suzanne Taylor Procedure Date: 06/02/2022 9:04 AM MRN: [id number] Endoscopist: Gatha Mayer , MD, [id number] Age: 51 Referring MD:  Date of Birth: 1970/09/02 Gender: Female Account #: [id number] Procedure:                Colonoscopy Indications:              Surveillance: Personal history of adenomatous                            polyps on last colonoscopy > 5 years ago; FHx CRCa                            maternal grandmother and polyps in mother Medicines:                Monitored Anesthesia Care Procedure:                Pre-Anesthesia Assessment:                           - Prior to the procedure, a History and Physical                            was performed, and patient medications and                            allergies were reviewed. The patient's tolerance of                            previous anesthesia was also reviewed. The risks                            and benefits of the procedure and the sedation                            options and risks were discussed with the patient.                            All questions were answered, and informed consent                            was obtained. Prior Anticoagulants: The patient has                            taken no anticoagulant or antiplatelet agents. ASA                            Grade Assessment: II - A patient with mild systemic                            disease. After reviewing the risks and benefits,                            the patient was deemed in satisfactory condition to  undergo the procedure.                           After obtaining informed consent, the colonoscope                            was passed under direct vision. Throughout the                            procedure, the patient's blood pressure, pulse, and                            oxygen saturations were monitored continuously. The                            Olympus CF-HQ190L  [id number]) Colonoscope was                            introduced through the anus and advanced to the the                            terminal ileum, with identification of the                            appendiceal orifice and IC valve. The colonoscopy                            was performed without difficulty. The patient                            tolerated the procedure well. The quality of the                            bowel preparation was good. The bowel preparation                            used was Miralax + SUPREP via extended prep with                            split dose instruction. The ileocecal valve,                            appendiceal orifice, and rectum were photographed. Scope In: 9:12:36 AM Scope Out: 9:31:13 AM Scope Withdrawal Time: 0 hours 14 minutes 26 seconds  Total Procedure Duration: 0 hours 18 minutes 37 seconds  Findings:                 The perianal and digital rectal examinations were                            normal.                           A 3 mm polyp was found in the transverse colon. The  polyp was sessile. The polyp was removed with a                            cold snare. Resection and retrieval were complete.                            Verification of patient identification for the                            specimen was done. Estimated blood loss was minimal.                           Multiple diverticula were found in the sigmoid                            colon, descending colon and transverse colon.                           The exam was otherwise without abnormality on                            direct and retroflexion views. Complications:            No immediate complications. Estimated Blood Loss:     Estimated blood loss was minimal. Impression:               - One 3 mm polyp in the transverse colon, removed                            with a cold snare. Resected and retrieved.                           -  Diverticulosis in the sigmoid colon, in the                            descending colon and in the transverse colon.                           - The examination was otherwise normal on direct                            and retroflexion views.                           - Personal history of colonic polyp - 5 mm adenoma                            2010 and FHx CRCa maternal grandmother + polyps in                            mothers. Recommendation:           - Patient has a contact number available for  emergencies. The signs and symptoms of potential                            delayed complications were discussed with the                            patient. Return to normal activities tomorrow.                            Written discharge instructions were provided to the                            patient.                           - Resume previous diet.                           - Continue present medications.                           - Await pathology results.                           - Repeat colonoscopy in 5 years. Gatha Mayer, MD 06/02/2022 9:40:21 AM This report has been signed electronically.

## 2022-06-02 NOTE — Progress Notes (Signed)
[facility] History and Physical   Primary Care Physician:  Kathyrn Lass, MD   Reason for Procedure:   Hx adenomatous colon polyp + FHx CRCA  Plan:    colonoscopy     HPI: Suzanne Taylor is a 51 y.o. female w/ hx 5 mm adenoma 2010, no polyps 2016. Mother had colon polyps and maternal grandmother had colon cancer   Past Medical History:  Diagnosis Date   Colon polyps    Endometrial polyp 03/24/04   H/O amenorrhea 2008   H/O constipation 2012   H/O fatigue 2005   H/O varicella    History of ovarian cyst    Hx of adenomatous polyp of colon 04/23/2009   Hypothyroidism    Irregular periods/menstrual cycles 03/24/04   Irregular periods/menstrual cycles 2005   Leg length inequality    PCOS (polycystic ovarian syndrome)    PONV (postoperative nausea and vomiting)    Yeast infection     Past Surgical History:  Procedure Laterality Date   COLONOSCOPY W/ POLYPECTOMY     HYSTEROSCOPY WITH D & C  03/24/2004   uterine polyps     WISDOM TOOTH EXTRACTION  12/2010   WRIST ARTHROSCOPY WITH DEBRIDEMENT Right 01/06/2019   Procedure: RIGHT WRIST ARTHROSCOPY WITH TRIANGULAR FIBROCARTILAGE COMPLEX DEBRIDEMENT REPAIR;  Surgeon: Milly Jakob, MD;  Location: [facility];  Service: Orthopedics;  Laterality: Right;   wrist cartilage repair  2006    Prior to Admission medications   Medication Sig Start Date End Date Taking? Authorizing Provider  levothyroxine (SYNTHROID) 75 MCG tablet Take 75 mcg by mouth every morning. 02/03/22  Yes [provider]  Vitamin D, Ergocalciferol, (DRISDOL) 1.25 MG (50000 UNIT) CAPS capsule Take 50,000 Units by mouth once a week. 12/19/21  Yes [provider]    Current Outpatient Medications  Medication Sig Dispense Refill   levothyroxine (SYNTHROID) 75 MCG tablet Take 75 mcg by mouth every morning.     Vitamin D, Ergocalciferol, (DRISDOL) 1.25 MG (50000 UNIT) CAPS capsule Take 50,000 Units by mouth once a week.      Current Facility-Administered Medications  Medication Dose Route Frequency Provider Last Rate Last Admin   0.9 %  sodium chloride infusion  500 mL Intravenous Once Gatha Mayer, MD        Allergies as of 06/02/2022 - Review Complete 06/02/2022  Allergen Reaction Noted   Ciprofloxacin Hives and Itching 02/02/2009    Family History  Problem Relation Age of Onset   Coronary artery disease Mother        CABG x4   Colon polyps Mother        precancerous   Heart disease Mother    Stroke Father    Hypertension Father    Kidney disease Father    Colon cancer Maternal Grandmother 95   Stroke Paternal Grandmother    Esophageal cancer Neg Hx    Rectal cancer Neg Hx    Stomach cancer Neg Hx     Social History   Socioeconomic History   Marital status: Divorced    Spouse name: Not on file   Number of children: Not on file   Years of education: Not on file   Highest education level: Not on file  Occupational History   Not on file  Tobacco Use   Smoking status: Never   Smokeless tobacco: Never  Vaping Use   Vaping Use: Not on file  Substance and Sexual Activity   Alcohol use: Yes  Alcohol/week: 0.0 standard drinks of alcohol    Comment: less than monthly   Drug use: No   Sexual activity: Not on file  Other Topics Concern   Not on file  Social History Narrative   Lives alone with dog.   Social Determinants of Health   Financial Resource Strain: Not on file  Food Insecurity: Not on file  Transportation Needs: Not on file  Physical Activity: Not on file  Stress: Not on file  Social Connections: Not on file  Intimate Partner Violence: Not on file    Review of Systems:  All other review of systems negative except as mentioned in the HPI.  Physical Exam: Vital signs BP 108/73   Pulse 79   Temp 98.2 F (36.8 C)   Ht '5\' 9"'$  (1.753 m)   Wt 202 lb (91.6 kg)   SpO2 98%   BMI 29.83 kg/m   General:   Alert,  Well-developed, well-nourished, pleasant and  cooperative in NAD Lungs:  Clear throughout to auscultation.   Heart:  Regular rate and rhythm; no murmurs, clicks, rubs,  or gallops. Abdomen:  Soft, nontender and nondistended. Normal bowel sounds.   Neuro/Psych:  Alert and cooperative. Normal mood and affect. A and O x 3   '@Yekaterina Escutia'$  Simonne Maffucci, MD, [facility] [phone] (pager) 06/02/2022 9:03 AM@

## 2022-06-02 NOTE — Progress Notes (Signed)
Called to room to assist during endoscopic procedure.  Patient ID and intended procedure confirmed with present staff. Received instructions for my participation in the procedure from the performing physician.  

## 2022-06-02 NOTE — Progress Notes (Signed)
Pt's states no medical or surgical changes since previsit or office visit. 

## 2022-06-02 NOTE — Progress Notes (Signed)
A and O x3. Report to RN. Tolerated MAC anesthesia well. 

## 2022-06-05 ENCOUNTER — Telehealth: Payer: Self-pay

## 2022-06-05 NOTE — Telephone Encounter (Signed)
Left message on follow up call. 

## 2022-06-15 ENCOUNTER — Encounter: Payer: Self-pay | Admitting: Internal Medicine
